# Patient Record
Sex: Female | Born: 1960 | Race: White | Hispanic: No | Marital: Married | Smoking: Never smoker
Health system: Southern US, Community
[De-identification: ages and names within clinical notes are randomized; demographics above are authoritative.]

## PROBLEM LIST (undated history)

## (undated) DIAGNOSIS — Z803 Family history of malignant neoplasm of breast: Secondary | ICD-10-CM

## (undated) DIAGNOSIS — Z800 Family history of malignant neoplasm of digestive organs: Secondary | ICD-10-CM

## (undated) HISTORY — DX: Family history of malignant neoplasm of breast: Z80.3

## (undated) HISTORY — DX: Family history of malignant neoplasm of digestive organs: Z80.0

---

## 1985-05-26 HISTORY — PX: TUBAL LIGATION: SHX77

## 2003-10-16 ENCOUNTER — Other Ambulatory Visit: Admission: RE | Admit: 2003-10-16 | Discharge: 2003-10-16 | Payer: Self-pay | Admitting: Gynecology

## 2004-01-30 ENCOUNTER — Other Ambulatory Visit: Admission: RE | Admit: 2004-01-30 | Discharge: 2004-01-30 | Payer: Self-pay | Admitting: Gynecology

## 2004-03-04 ENCOUNTER — Encounter: Admission: RE | Admit: 2004-03-04 | Discharge: 2004-03-04 | Payer: Self-pay | Admitting: Gynecology

## 2004-04-25 ENCOUNTER — Other Ambulatory Visit: Admission: RE | Admit: 2004-04-25 | Discharge: 2004-04-25 | Payer: Self-pay | Admitting: Gynecology

## 2004-10-14 ENCOUNTER — Encounter: Admission: RE | Admit: 2004-10-14 | Discharge: 2004-10-14 | Payer: Self-pay | Admitting: Gynecology

## 2004-11-20 ENCOUNTER — Other Ambulatory Visit: Admission: RE | Admit: 2004-11-20 | Discharge: 2004-11-20 | Payer: Self-pay | Admitting: Gynecology

## 2005-06-05 ENCOUNTER — Other Ambulatory Visit: Admission: RE | Admit: 2005-06-05 | Discharge: 2005-06-05 | Payer: Self-pay | Admitting: Gynecology

## 2005-06-23 ENCOUNTER — Encounter: Admission: RE | Admit: 2005-06-23 | Discharge: 2005-06-23 | Payer: Self-pay | Admitting: Gynecology

## 2005-07-11 ENCOUNTER — Encounter: Admission: RE | Admit: 2005-07-11 | Discharge: 2005-07-11 | Payer: Self-pay | Admitting: Gynecology

## 2006-05-26 HISTORY — PX: BREAST BIOPSY: SHX20

## 2006-07-23 ENCOUNTER — Other Ambulatory Visit: Admission: RE | Admit: 2006-07-23 | Discharge: 2006-07-23 | Payer: Self-pay | Admitting: Gynecology

## 2006-08-05 ENCOUNTER — Encounter: Admission: RE | Admit: 2006-08-05 | Discharge: 2006-08-05 | Payer: Self-pay | Admitting: Gynecology

## 2006-08-13 ENCOUNTER — Encounter: Admission: RE | Admit: 2006-08-13 | Discharge: 2006-08-13 | Payer: Self-pay | Admitting: Gynecology

## 2007-02-10 ENCOUNTER — Encounter: Admission: RE | Admit: 2007-02-10 | Discharge: 2007-02-10 | Payer: Self-pay | Admitting: Gynecology

## 2007-02-16 ENCOUNTER — Encounter (INDEPENDENT_AMBULATORY_CARE_PROVIDER_SITE_OTHER): Payer: Self-pay | Admitting: Diagnostic Radiology

## 2007-02-16 ENCOUNTER — Encounter: Admission: RE | Admit: 2007-02-16 | Discharge: 2007-02-16 | Payer: Self-pay | Admitting: Gynecology

## 2007-08-09 ENCOUNTER — Encounter: Admission: RE | Admit: 2007-08-09 | Discharge: 2007-08-09 | Payer: Self-pay | Admitting: Gynecology

## 2008-08-09 ENCOUNTER — Encounter: Admission: RE | Admit: 2008-08-09 | Discharge: 2008-08-09 | Payer: Self-pay | Admitting: Gynecology

## 2009-08-10 ENCOUNTER — Encounter: Admission: RE | Admit: 2009-08-10 | Discharge: 2009-08-10 | Payer: Self-pay | Admitting: Gynecology

## 2010-07-17 ENCOUNTER — Other Ambulatory Visit: Payer: Self-pay | Admitting: Gynecology

## 2010-07-17 DIAGNOSIS — Z1231 Encounter for screening mammogram for malignant neoplasm of breast: Secondary | ICD-10-CM

## 2010-08-21 ENCOUNTER — Ambulatory Visit: Payer: Self-pay

## 2010-08-21 ENCOUNTER — Ambulatory Visit
Admission: RE | Admit: 2010-08-21 | Discharge: 2010-08-21 | Disposition: A | Discharge status: Home / Self Care | Payer: 59 | Source: Ambulatory Visit | Attending: Gynecology | Admitting: Gynecology

## 2010-08-21 DIAGNOSIS — Z1231 Encounter for screening mammogram for malignant neoplasm of breast: Secondary | ICD-10-CM

## 2011-04-09 ENCOUNTER — Other Ambulatory Visit: Payer: Self-pay | Admitting: Gynecology

## 2011-05-27 HISTORY — PX: COLONOSCOPY: SHX174

## 2011-08-28 ENCOUNTER — Other Ambulatory Visit: Payer: Self-pay | Admitting: Dermatology

## 2011-09-11 ENCOUNTER — Other Ambulatory Visit: Payer: Self-pay | Admitting: Gynecology

## 2011-09-11 DIAGNOSIS — Z1231 Encounter for screening mammogram for malignant neoplasm of breast: Secondary | ICD-10-CM

## 2011-09-30 ENCOUNTER — Ambulatory Visit
Admission: RE | Admit: 2011-09-30 | Discharge: 2011-09-30 | Disposition: A | Discharge status: Home / Self Care | Payer: 59 | Source: Ambulatory Visit | Attending: Gynecology | Admitting: Gynecology

## 2011-09-30 DIAGNOSIS — Z1231 Encounter for screening mammogram for malignant neoplasm of breast: Secondary | ICD-10-CM

## 2012-07-16 ENCOUNTER — Other Ambulatory Visit: Payer: Self-pay | Admitting: Gynecology

## 2012-10-06 ENCOUNTER — Other Ambulatory Visit: Payer: Self-pay

## 2012-10-06 DIAGNOSIS — Z1231 Encounter for screening mammogram for malignant neoplasm of breast: Secondary | ICD-10-CM

## 2012-11-02 ENCOUNTER — Other Ambulatory Visit: Payer: Self-pay | Admitting: Dermatology

## 2012-11-09 ENCOUNTER — Ambulatory Visit
Admission: RE | Admit: 2012-11-09 | Discharge: 2012-11-09 | Disposition: A | Discharge status: Home / Self Care | Payer: 59 | Source: Ambulatory Visit

## 2012-11-09 DIAGNOSIS — Z1231 Encounter for screening mammogram for malignant neoplasm of breast: Secondary | ICD-10-CM

## 2014-01-16 ENCOUNTER — Other Ambulatory Visit: Payer: Self-pay

## 2014-01-16 DIAGNOSIS — Z1231 Encounter for screening mammogram for malignant neoplasm of breast: Secondary | ICD-10-CM

## 2014-02-02 ENCOUNTER — Encounter (INDEPENDENT_AMBULATORY_CARE_PROVIDER_SITE_OTHER): Payer: Self-pay

## 2014-02-02 ENCOUNTER — Ambulatory Visit
Admission: RE | Admit: 2014-02-02 | Discharge: 2014-02-02 | Disposition: A | Discharge status: Home / Self Care | Payer: 59 | Source: Ambulatory Visit

## 2014-02-02 DIAGNOSIS — Z1231 Encounter for screening mammogram for malignant neoplasm of breast: Secondary | ICD-10-CM

## 2015-04-16 ENCOUNTER — Other Ambulatory Visit: Payer: Self-pay

## 2015-04-16 DIAGNOSIS — Z1231 Encounter for screening mammogram for malignant neoplasm of breast: Secondary | ICD-10-CM

## 2015-05-29 ENCOUNTER — Ambulatory Visit
Admission: RE | Admit: 2015-05-29 | Discharge: 2015-05-29 | Disposition: A | Discharge status: Home / Self Care | Payer: Commercial Managed Care - HMO | Source: Ambulatory Visit

## 2015-05-29 DIAGNOSIS — Z1231 Encounter for screening mammogram for malignant neoplasm of breast: Secondary | ICD-10-CM

## 2015-12-12 ENCOUNTER — Other Ambulatory Visit: Payer: Self-pay | Admitting: Obstetrics and Gynecology

## 2015-12-12 DIAGNOSIS — E2839 Other primary ovarian failure: Secondary | ICD-10-CM

## 2015-12-24 ENCOUNTER — Ambulatory Visit
Admission: RE | Admit: 2015-12-24 | Discharge: 2015-12-24 | Disposition: A | Discharge status: Home / Self Care | Payer: Commercial Managed Care - HMO | Source: Ambulatory Visit | Attending: Obstetrics and Gynecology | Admitting: Obstetrics and Gynecology

## 2015-12-24 DIAGNOSIS — E2839 Other primary ovarian failure: Secondary | ICD-10-CM

## 2016-07-03 ENCOUNTER — Other Ambulatory Visit: Payer: Self-pay | Admitting: Obstetrics and Gynecology

## 2016-07-03 DIAGNOSIS — Z1231 Encounter for screening mammogram for malignant neoplasm of breast: Secondary | ICD-10-CM

## 2016-07-10 DIAGNOSIS — G43B0 Ophthalmoplegic migraine, not intractable: Secondary | ICD-10-CM | POA: Diagnosis not present

## 2016-07-18 ENCOUNTER — Encounter: Payer: Self-pay | Admitting: Radiology

## 2016-07-18 ENCOUNTER — Ambulatory Visit
Admission: RE | Admit: 2016-07-18 | Discharge: 2016-07-18 | Disposition: A | Discharge status: Home / Self Care | Payer: Commercial Managed Care - HMO | Source: Ambulatory Visit | Attending: Obstetrics and Gynecology | Admitting: Obstetrics and Gynecology

## 2016-07-18 DIAGNOSIS — Z1231 Encounter for screening mammogram for malignant neoplasm of breast: Secondary | ICD-10-CM

## 2017-02-16 DIAGNOSIS — Z130 Encounter for screening for diseases of the blood and blood-forming organs and certain disorders involving the immune mechanism: Secondary | ICD-10-CM | POA: Diagnosis not present

## 2017-02-16 DIAGNOSIS — Z01419 Encounter for gynecological examination (general) (routine) without abnormal findings: Secondary | ICD-10-CM | POA: Diagnosis not present

## 2017-07-21 ENCOUNTER — Other Ambulatory Visit: Payer: Self-pay | Admitting: Obstetrics and Gynecology

## 2017-07-21 DIAGNOSIS — Z1231 Encounter for screening mammogram for malignant neoplasm of breast: Secondary | ICD-10-CM

## 2017-08-11 ENCOUNTER — Ambulatory Visit
Admission: RE | Admit: 2017-08-11 | Discharge: 2017-08-11 | Disposition: A | Discharge status: Home / Self Care | Payer: 59 | Source: Ambulatory Visit | Attending: Obstetrics and Gynecology | Admitting: Obstetrics and Gynecology

## 2017-08-11 DIAGNOSIS — Z1231 Encounter for screening mammogram for malignant neoplasm of breast: Secondary | ICD-10-CM

## 2017-08-18 DIAGNOSIS — R05 Cough: Secondary | ICD-10-CM | POA: Diagnosis not present

## 2017-08-18 DIAGNOSIS — J209 Acute bronchitis, unspecified: Secondary | ICD-10-CM | POA: Diagnosis not present

## 2017-08-18 DIAGNOSIS — J040 Acute laryngitis: Secondary | ICD-10-CM | POA: Diagnosis not present

## 2017-09-01 DIAGNOSIS — J069 Acute upper respiratory infection, unspecified: Secondary | ICD-10-CM | POA: Diagnosis not present

## 2017-09-01 DIAGNOSIS — R0989 Other specified symptoms and signs involving the circulatory and respiratory systems: Secondary | ICD-10-CM | POA: Diagnosis not present

## 2017-09-01 DIAGNOSIS — J208 Acute bronchitis due to other specified organisms: Secondary | ICD-10-CM | POA: Diagnosis not present

## 2017-09-04 ENCOUNTER — Encounter (HOSPITAL_COMMUNITY): Payer: Self-pay | Admitting: Emergency Medicine

## 2017-09-04 ENCOUNTER — Other Ambulatory Visit: Payer: Self-pay

## 2017-09-04 ENCOUNTER — Emergency Department (HOSPITAL_COMMUNITY)
Admission: EM | Admit: 2017-09-04 | Discharge: 2017-09-04 | Disposition: A | Discharge status: Home / Self Care | Payer: 59 | Attending: Emergency Medicine | Admitting: Emergency Medicine

## 2017-09-04 ENCOUNTER — Emergency Department (HOSPITAL_COMMUNITY): Payer: 59

## 2017-09-04 DIAGNOSIS — R000 Tachycardia, unspecified: Secondary | ICD-10-CM | POA: Diagnosis not present

## 2017-09-04 DIAGNOSIS — R05 Cough: Secondary | ICD-10-CM | POA: Diagnosis not present

## 2017-09-04 DIAGNOSIS — J40 Bronchitis, not specified as acute or chronic: Secondary | ICD-10-CM | POA: Diagnosis not present

## 2017-09-04 DIAGNOSIS — R0981 Nasal congestion: Secondary | ICD-10-CM | POA: Diagnosis not present

## 2017-09-04 MED ORDER — ALBUTEROL SULFATE (2.5 MG/3ML) 0.083% IN NEBU
5.0000 mg | INHALATION_SOLUTION | Freq: Once | RESPIRATORY_TRACT | Status: AC
  Administered 2017-09-04: 5 mg via RESPIRATORY_TRACT
  Filled 2017-09-04: qty 6

## 2017-09-04 MED ORDER — PREDNISONE 20 MG PO TABS: 40.0000 mg | ORAL_TABLET | Freq: Every day | ORAL | 0 refills | Status: DC

## 2017-09-04 MED ORDER — PREDNISONE 20 MG PO TABS
60.0000 mg | ORAL_TABLET | Freq: Once | ORAL | Status: AC
  Administered 2017-09-04: 60 mg via ORAL
  Filled 2017-09-04: qty 3

## 2017-09-04 MED ORDER — IBUPROFEN 400 MG PO TABS
600.0000 mg | ORAL_TABLET | Freq: Once | ORAL | Status: AC
  Administered 2017-09-04: 600 mg via ORAL
  Filled 2017-09-04: qty 1

## 2017-09-04 NOTE — Discharge Instructions (Signed)
Continue using your albuterol inhaler every 4-6 hours as needed. Continue taking the benzonatate every 8 hours for cough. You can take Mucinex if this provides some relief of your congestion.  Be sure to drink plenty of water with this. Starting tomorrow, take the prednisone daily as prescribed until it is gone. Call the phone number on the back of your insurance card for primary care in the area.

## 2017-09-04 NOTE — ED Triage Notes (Addendum)
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

## 2017-09-04 NOTE — ED Provider Notes (Signed)
[facility] EMERGENCY DEPARTMENT Provider Note   CSN: [id number] Arrival date & time: 09/04/17  4098     History   Chief Complaint Chief Complaint  Patient presents with  . Cough  . Chest Pain    HPI Megan Rodriguez is a 57 y.o. female past medical history, presenting to the ED with persistent cough.  Patient states she had an upper respiratory infection a few weeks ago, and was prescribed azithromycin and an albuterol inhaler.  She states the symptoms never really resolved.  She states she has been having persistent cough since that time.  Was reevaluated and prescribed Tessalon and instructed to take Mucinex.  She states the albuterol and Tessalon helps, however the symptoms persist.  She states she is starting to feel sore on her bilateral lower ribs with coughing.  States cough is nonproductive.  Reports associated congestion.  Denies fever or chills, difficulty breathing, or other complaints.  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  History of smoking, asthma, COPD.  The history is provided by the patient.    History reviewed. No pertinent past medical history.  There are no active problems to display for this patient.   No past surgical history on file.   OB History   None      Home Medications    Prior to Admission medications   Medication Sig Start Date End Date Taking? Authorizing Provider  predniSONE (DELTASONE) 20 MG tablet Take 2 tablets (40 mg total) by mouth daily. 09/04/17   Robinson, [place] N, PA-C    Family History No family history on file.  Social History Social History   Tobacco Use  . Smoking status: Never Smoker  . Smokeless tobacco: Never Used  Substance Use Topics  . Alcohol use: Yes  . Drug use: Never     Allergies   Patient has no known allergies.   Review of Systems Review of Systems  Constitutional: Negative for chills and fever.  HENT: Positive for congestion. Negative for  sore throat.   Respiratory: Positive for cough. Negative for shortness of breath.   All other systems reviewed and are negative.    Physical Exam Updated Vital Signs BP (!) 142/81 (BP Location: Right Arm)   Pulse 88   Temp 99 F (37.2 C) (Oral)   Resp 20   LMP 05/18/2015 (Approximate)   SpO2 95%   Physical Exam  Constitutional: She appears well-developed and well-nourished. She does not appear ill. No distress.  HENT:  Head: Normocephalic and atraumatic.  Right Ear: Tympanic membrane, external ear and ear canal normal.  Left Ear: Tympanic membrane, external ear and ear canal normal.  Mouth/Throat: Oropharynx is clear and moist.  Eyes: Conjunctivae are normal.  Neck: Normal range of motion. Neck supple.  Cardiovascular: Normal rate, regular rhythm, normal heart sounds and intact distal pulses.  Pulmonary/Chest: Effort normal and breath sounds normal. No stridor. No respiratory distress. She has no wheezes. She has no rales.  Abdominal: Soft.  Lymphadenopathy:    She has no cervical adenopathy.  Neurological: She is alert.  Skin: Skin is warm.  Psychiatric: She has a normal mood and affect. Her behavior is normal.  Nursing note and vitals reviewed.    ED Treatments / Results  Labs (all labs ordered are listed, but only abnormal results are displayed) Labs Reviewed - No data to display  EKG None  Radiology Dg Chest 2 View  Result Date: 09/04/2017 CLINICAL  DATA:  Dry cough x 6 weeks, pain into left lower back and lower bilateral anterior chest, aches, crackles/wheezing, past smoker, pt shielded EXAM: CHEST - 2 VIEW COMPARISON:  None. FINDINGS: The heart size and mediastinal contours are within normal limits. Both lungs are clear. The visualized skeletal structures are unremarkable. IMPRESSION: No active cardiopulmonary disease. No evidence of pneumonia or pulmonary edema. Electronically Signed   By: Bary RichardStan  Maynard M.D.   On: 09/04/2017 09:47    Procedures Procedures  (including critical care time)  Medications Ordered in ED Medications  albuterol (PROVENTIL) (2.5 MG/3ML) 0.083% nebulizer solution 5 mg (5 mg Nebulization Given 09/04/17 1211)  predniSONE (DELTASONE) tablet 60 mg (60 mg Oral Given 09/04/17 1220)  ibuprofen (ADVIL,MOTRIN) tablet 600 mg (600 mg Oral Given 09/04/17 1220)     Initial Impression / Assessment and Plan / ED Course  I have reviewed the triage vital signs and the nursing notes.  Pertinent labs & imaging results that were available during my care of the patient were reviewed by me and considered in my medical decision making (see chart for details).     Pt presenting with persistent cough follow URI. Tessalon and albuterol have provided relief, however cough persists. On exam, afebrile, not in distress. Lungs CTAB. CXR neg for infiltrate. Albuterol neb treatment given in ED with improvement, PO prednisone. Will discharge with prednisone burst and instructions to continue tessalon and albuterol. Return precautions discussed. Safe for discharge.  Discussed results, findings, treatment and follow up. Patient advised of return precautions. Patient verbalized understanding and agreed with plan.  Final Clinical Impressions(s) / ED Diagnoses   Final diagnoses:  Bronchitis    ED Discharge Orders        Ordered    predniSONE (DELTASONE) 20 MG tablet  Daily     09/04/17 1317       Robinson, [place] N, [place] JerseyPA-C 09/04/17 1318    Mesner, Barbara CowerJason, MD 09/04/17 1643

## 2017-10-26 ENCOUNTER — Encounter

## 2017-10-26 ENCOUNTER — Ambulatory Visit: Payer: 59 | Admitting: Internal Medicine

## 2017-11-05 DIAGNOSIS — J019 Acute sinusitis, unspecified: Secondary | ICD-10-CM | POA: Diagnosis not present

## 2017-11-05 DIAGNOSIS — H66001 Acute suppurative otitis media without spontaneous rupture of ear drum, right ear: Secondary | ICD-10-CM | POA: Diagnosis not present

## 2017-11-05 DIAGNOSIS — J029 Acute pharyngitis, unspecified: Secondary | ICD-10-CM | POA: Diagnosis not present

## 2017-11-09 ENCOUNTER — Encounter: Payer: Self-pay | Admitting: Physician Assistant

## 2017-11-09 ENCOUNTER — Ambulatory Visit (INDEPENDENT_AMBULATORY_CARE_PROVIDER_SITE_OTHER): Payer: 59 | Admitting: Physician Assistant

## 2017-11-09 ENCOUNTER — Other Ambulatory Visit: Payer: Self-pay

## 2017-11-09 ENCOUNTER — Ambulatory Visit: Payer: 59 | Admitting: Physician Assistant

## 2017-11-09 VITALS — BP 136/84 | HR 76 | Temp 98.4°F | Resp 14 | Ht 63.0 in | Wt 133.0 lb

## 2017-11-09 DIAGNOSIS — Z23 Encounter for immunization: Secondary | ICD-10-CM | POA: Diagnosis not present

## 2017-11-09 DIAGNOSIS — Z0000 Encounter for general adult medical examination without abnormal findings: Secondary | ICD-10-CM | POA: Diagnosis not present

## 2017-11-09 NOTE — Progress Notes (Signed)
Patient ID: Megan Rodriguez MRN: [id number], DOB: August 30, 1960, 57 y.o. Date of Encounter: 11/09/2017,   Chief Complaint: New Patient, Establish Care, Physical (CPE)  HPI: 57 y.o. y/o female here for New Patient, Establish Care, CPE.   She presents as a new patient to establish care. She states that currently for GYN she sees Dr. Rosemary Holms.   Says that her prior GYN retired but they checked screening labs.   Therefore did not have any PCP.   However over the past year she has had issues with allergies and respiratory infections so has had to go to urgent care some.   Therefore decided she needed to have PCP established that she can go to for acute visits as well as checking labs and doing preventive care.  She reports that she has no known past medical history/medical problems.  No chronic medical problems.  She works at [employer].  Works 4 days a week. She has 2 grandsons who are 73 years old and 25 years old.  She keeps them 2 or 3 nights per week and also sees them on the weekends a lot.  States that their mom is a Tourist information centre manager so she watches them while she teaches dance those evenings.  She does no formal exercise.  She has no specific concerns to address today.  She has no premature family history of CAD or CA.   Review of Systems: Consitutional: No fever, chills, fatigue, night sweats, lymphadenopathy. No significant/unexplained weight changes. Eyes: No visual changes, eye redness, or discharge. ENT/Mouth: No ear pain, sore throat, nasal drainage, or sinus pain. Cardiovascular: No chest pressure,heaviness, tightness or squeezing, even with exertion. No increased shortness of breath or dyspnea on exertion.No palpitations, edema, orthopnea, PND. Respiratory: No cough, hemoptysis, SOB, or wheezing. Gastrointestinal: No anorexia, dysphagia, reflux, pain, nausea, vomiting, hematemesis, diarrhea, constipation, BRBPR, or melena. Breast: No mass, nodules, bulging, or retraction. No skin  changes or inflammation. No nipple discharge. No lymphadenopathy. Genitourinary: No dysuria, hematuria, incontinence, vaginal discharge, pruritis, burning, abnormal bleeding, or pain. Musculoskeletal: No decreased ROM, No joint pain or swelling. No significant pain in neck, back, or extremities. Skin: No rash, pruritis, or concerning lesions. Neurological: No headache, dizziness, syncope, seizures, tremors, memory loss, coordination problems, or paresthesias. Psychological: No anxiety, depression, hallucinations, SI/HI. Endocrine: No polydipsia, polyphagia, polyuria, or known diabetes.No increased fatigue. No palpitations/rapid heart rate. No significant/unexplained weight change. All other systems were reviewed and are otherwise negative.  No past medical history on file.   Past Surgical History:  Procedure Laterality Date  . TUBAL LIGATION  1987    Home Meds:  Outpatient Medications Prior to Visit  Medication Sig Dispense Refill  . predniSONE (DELTASONE) 20 MG tablet Take 2 tablets (40 mg total) by mouth daily. 10 tablet 0   No facility-administered medications prior to visit.     Allergies: No Known Allergies  Social History   Socioeconomic History  . Marital status: Married    Spouse name: Not on file  . Number of children: Not on file  . Years of education: Not on file  . Highest education level: Not on file  Occupational History  . Not on file  Social Needs  . Financial resource strain: Not on file  . Food insecurity:    Worry: Not on file    Inability: Not on file  . Transportation needs:    Medical: Not on file    Non-medical: Not on file  Tobacco Use  . Smoking status: Never  Smoker  . Smokeless tobacco: Never Used  Substance and Sexual Activity  . Alcohol use: Yes  . Drug use: Never  . Sexual activity: Not on file  Lifestyle  . Physical activity:    Days per week: Not on file    Minutes per session: Not on file  . Stress: Not on file  Relationships  .  Social connections:    Talks on phone: Not on file    Gets together: Not on file    Attends religious service: Not on file    Active member of club or organization: Not on file    Attends meetings of clubs or organizations: Not on file    Relationship status: Not on file  . Intimate partner violence:    Fear of current or ex partner: Not on file    Emotionally abused: Not on file    Physically abused: Not on file    Forced sexual activity: Not on file  Other Topics Concern  . Not on file  Social History Narrative  . Not on file    Family History  Problem Relation Age of Onset  . Stroke Mother        past smoker  . Cancer Maternal Grandmother   . Heart disease Maternal Grandfather        MI--- at older age  . Diabetes Paternal Grandmother     Physical Exam: Blood pressure 136/84, pulse 76, temperature 98.4 F (36.9 C), temperature source Oral, resp. rate 14, height 5\' 3"  (1.6 m), weight 60.3 kg (133 lb), last menstrual period 05/18/2015, SpO2 98 %., Body mass index is 23.56 kg/m. General: Well developed, well nourished WF. Appears in no acute distress. HEENT: Normocephalic, atraumatic. Conjunctiva pink, sclera non-icteric. Pupils 2 mm constricting to 1 mm, round, regular, and equally reactive to light and accomodation. EOMI. Internal auditory canal clear. TMs with good cone of light and without pathology. Nasal mucosa pink. Nares are without discharge. No sinus tenderness. Oral mucosa pink. Neck: Supple. Trachea midline. No thyromegaly. Full ROM. No lymphadenopathy.No Carotid Bruits. Lungs: Clear to auscultation bilaterally without wheezes, rales, or rhonchi. Breathing is of normal effort and unlabored. Cardiovascular: RRR with S1 S2. No murmurs, rubs, or gallops. Distal pulses 2+ symmetrically. No carotid or abdominal bruits. Breast: Per Gyn Abdomen: Soft, non-tender, non-distended with normoactive bowel sounds. No hepatosplenomegaly or masses. No rebound/guarding. No CVA  tenderness. No hernias.  Genitourinary:  Per Gyn Musculoskeletal: Full range of motion and 5/5 strength throughout.  Skin: Warm and moist without erythema, ecchymosis, wounds, or rash. Neuro: A+Ox3. CN II-XII grossly intact. Moves all extremities spontaneously. Full sensation throughout. Normal gait.  Psych:  Responds to questions appropriately with a normal affect.   Assessment/Plan:  57 y.o. y/o female here for CPE  1. Encounter for preventive health examination 2. Encounter for medical examination to establish care  A. Screening Labs: She is fasting.  Agreeable to check labs now. - CBC with Differential/Platelet - COMPLETE METABOLIC PANEL WITH GFR - Lipid panel - TSH - VITAMIN D 25 Hydroxy (Vit-D Deficiency, Fractures)  B. Pap: --Per Gyn--Dr. Sherrie Sportavon  C. Screening Mammogram: --Per Gyn--Dr. Rosemary Holmsavon --This is in epic.  Mammogram was performed 08/11/2017.  Negative.   D. DEXA/BMD:   E. Colorectal Cancer Screening: She reports that she had colonoscopy at age 57-51.  Performed by Dr. Kinnie ScalesMedoff.  Normal.  Repeat 10 years.  F. Immunizations:  Influenza:--------------N/A Tetanus:----She reports that she has had no tetanus vaccine in > 10 years.  She  is agreeable to get Tdap today.  Tdap--Given here 11/09/2017. Pneumococcal: she has no indication to require pneumonia vaccine until age 43. Shingrix:   Today I discussed risk and benefits of Shingrix.  If she decides that she wants to get this she will contact her insurance regarding cost and coverage and then if still wants to proceed will go to pharmacy to receive vaccine there.    Signed, [address] [place], [place], [facility] 11/09/2017 10:40 AM

## 2017-11-09 NOTE — Addendum Note (Signed)
Addended by: Phineas SemenJOHNSON, Chigozie Basaldua A on: 11/09/2017 11:22 AM   Modules accepted: Orders

## 2017-11-10 LAB — COMPLETE METABOLIC PANEL WITH GFR
AG Ratio: 1.7 (calc) (ref 1.0–2.5)
ALT: 28 U/L (ref 6–29)
AST: 20 U/L (ref 10–35)
Albumin: 4.7 g/dL (ref 3.6–5.1)
Alkaline phosphatase (APISO): 59 U/L (ref 33–130)
BUN: 13 mg/dL (ref 7–25)
CO2: 27 mmol/L (ref 20–32)
Calcium: 10 mg/dL (ref 8.6–10.4)
Chloride: 102 mmol/L (ref 98–110)
Creat: 0.72 mg/dL (ref 0.50–1.05)
GFR, Est African American: 108 mL/min/{1.73_m2} (ref >=60)
GFR, Est Non African American: 93 mL/min/{1.73_m2} (ref >=60)
Globulin: 2.8 g/dL (calc) (ref 1.9–3.7)
Glucose, Bld: 102 mg/dL — ABNORMAL HIGH (ref 65–99)
Potassium: 4.4 mmol/L (ref 3.5–5.3)
Sodium: 140 mmol/L (ref 135–146)
Total Bilirubin: 0.8 mg/dL (ref 0.2–1.2)
Total Protein: 7.5 g/dL (ref 6.1–8.1)

## 2017-11-10 LAB — CBC WITH DIFFERENTIAL/PLATELET
Basophils Absolute: 99 cells/uL (ref 0–200)
Basophils Relative: 1.6 %
Eosinophils Absolute: 81 cells/uL (ref 15–500)
Eosinophils Relative: 1.3 %
HCT: 39.9 % (ref 35.0–45.0)
Hemoglobin: 14 g/dL (ref 11.7–15.5)
Lymphs Abs: 1835 cells/uL (ref 850–3900)
MCH: 29.4 pg (ref 27.0–33.0)
MCHC: 35.1 g/dL (ref 32.0–36.0)
MCV: 83.6 fL (ref 80.0–100.0)
MPV: 11 fL (ref 7.5–12.5)
Monocytes Relative: 8.9 %
Neutro Abs: 3633 cells/uL (ref 1500–7800)
Neutrophils Relative %: 58.6 %
Platelets: 274 10*3/uL (ref 140–400)
RBC: 4.77 10*6/uL (ref 3.80–5.10)
RDW: 13.5 % (ref 11.0–15.0)
Total Lymphocyte: 29.6 %
WBC mixed population: 552 cells/uL (ref 200–950)
WBC: 6.2 10*3/uL (ref 3.8–10.8)

## 2017-11-10 LAB — LIPID PANEL
Cholesterol: 213 mg/dL — ABNORMAL HIGH (ref <200)
HDL: 46 mg/dL — ABNORMAL LOW (ref >50)
LDL Cholesterol (Calc): 141 mg/dL (calc) — ABNORMAL HIGH
Non-HDL Cholesterol (Calc): 167 mg/dL (calc) — ABNORMAL HIGH (ref <130)
Total CHOL/HDL Ratio: 4.6 (calc) (ref <5.0)
Triglycerides: 133 mg/dL (ref <150)

## 2017-11-10 LAB — VITAMIN D 25 HYDROXY (VIT D DEFICIENCY, FRACTURES): Vit D, 25-Hydroxy: 33 ng/mL (ref 30–100)

## 2017-11-10 LAB — TSH: TSH: 1.27 mIU/L (ref 0.40–4.50)

## 2018-05-27 DIAGNOSIS — Z23 Encounter for immunization: Secondary | ICD-10-CM | POA: Diagnosis not present

## 2018-10-28 ENCOUNTER — Other Ambulatory Visit: Payer: Self-pay | Admitting: Obstetrics and Gynecology

## 2018-10-28 DIAGNOSIS — Z1231 Encounter for screening mammogram for malignant neoplasm of breast: Secondary | ICD-10-CM

## 2018-12-14 ENCOUNTER — Encounter: Payer: 59 | Admitting: Family Medicine

## 2018-12-15 ENCOUNTER — Ambulatory Visit: Payer: 59

## 2018-12-31 ENCOUNTER — Other Ambulatory Visit: Payer: Self-pay

## 2018-12-31 ENCOUNTER — Telehealth: Payer: Self-pay | Admitting: Family Medicine

## 2018-12-31 ENCOUNTER — Encounter: Payer: Self-pay | Admitting: Family Medicine

## 2018-12-31 DIAGNOSIS — Z20828 Contact with and (suspected) exposure to other viral communicable diseases: Secondary | ICD-10-CM

## 2018-12-31 DIAGNOSIS — Z20822 Contact with and (suspected) exposure to covid-19: Secondary | ICD-10-CM

## 2018-12-31 NOTE — Telephone Encounter (Signed)
Patient was exposed to covid at her work place, now is nauseous and wants to know what she should do  Please call and advise  [phone]

## 2018-12-31 NOTE — Telephone Encounter (Signed)
FYI.  Orders have been placed.

## 2018-12-31 NOTE — Telephone Encounter (Signed)
See pt message note - this has already been taken addressed

## 2019-01-01 LAB — NOVEL CORONAVIRUS, NAA: SARS-CoV-2, NAA: NOT DETECTED

## 2019-01-17 ENCOUNTER — Ambulatory Visit: Payer: 59

## 2019-03-01 ENCOUNTER — Ambulatory Visit
Admission: RE | Admit: 2019-03-01 | Discharge: 2019-03-01 | Disposition: A | Discharge status: Home / Self Care | Payer: 59 | Source: Ambulatory Visit | Attending: Obstetrics and Gynecology | Admitting: Obstetrics and Gynecology

## 2019-03-01 ENCOUNTER — Other Ambulatory Visit: Payer: Self-pay

## 2019-03-01 DIAGNOSIS — Z1231 Encounter for screening mammogram for malignant neoplasm of breast: Secondary | ICD-10-CM

## 2019-11-14 ENCOUNTER — Encounter: Payer: Self-pay | Admitting: Nurse Practitioner

## 2019-11-14 ENCOUNTER — Other Ambulatory Visit: Payer: Self-pay

## 2019-11-14 ENCOUNTER — Ambulatory Visit (INDEPENDENT_AMBULATORY_CARE_PROVIDER_SITE_OTHER): Payer: 59 | Admitting: Nurse Practitioner

## 2019-11-14 VITALS — BP 138/90 | HR 85 | Temp 98.4°F | Resp 18 | Ht 62.0 in | Wt 138.4 lb

## 2019-11-14 DIAGNOSIS — Z13220 Encounter for screening for lipoid disorders: Secondary | ICD-10-CM

## 2019-11-14 DIAGNOSIS — R030 Elevated blood-pressure reading, without diagnosis of hypertension: Secondary | ICD-10-CM

## 2019-11-14 DIAGNOSIS — Z13228 Encounter for screening for other metabolic disorders: Secondary | ICD-10-CM | POA: Diagnosis not present

## 2019-11-14 DIAGNOSIS — Z1321 Encounter for screening for nutritional disorder: Secondary | ICD-10-CM | POA: Diagnosis not present

## 2019-11-14 DIAGNOSIS — Z0001 Encounter for general adult medical examination with abnormal findings: Secondary | ICD-10-CM

## 2019-11-14 DIAGNOSIS — Z1159 Encounter for screening for other viral diseases: Secondary | ICD-10-CM

## 2019-11-14 DIAGNOSIS — Z23 Encounter for immunization: Secondary | ICD-10-CM

## 2019-11-14 DIAGNOSIS — Z1329 Encounter for screening for other suspected endocrine disorder: Secondary | ICD-10-CM

## 2019-11-14 HISTORY — DX: Elevated blood-pressure reading, without diagnosis of hypertension: R03.0

## 2019-11-14 NOTE — Progress Notes (Signed)
Established Patient Office Visit  Subjective:  Patient ID: Megan Rodriguez, female    DOB: November 19, 1960  Age: 59 y.o. MRN: [id number]  CC:  Chief Complaint  Patient presents with  . Annual Exam    no pap    HPI Megan Rodriguez is a 59 year old female presenting for annual general adult examination. She feels in overall good health. Denied cp, ct, gu/gi sxs, pain, sob, edema, palpitations, recent injury/falls, or skin concerns.   Time spent discussing blood pressure treatment plan. The pt reports that she has h/o white coat syndrome.   Past Medical History:  Diagnosis Date  . White coat syndrome without diagnosis of hypertension 11/14/2019    Past Surgical History:  Procedure Laterality Date  . TUBAL LIGATION  1987    Family History  Problem Relation Age of Onset  . Stroke Mother        past smoker  . Cancer Maternal Grandmother   . Heart disease Maternal Grandfather        MI--- at older age  . Diabetes Paternal Grandmother     Social History   Socioeconomic History  . Marital status: Married    Spouse name: Not on file  . Number of children: Not on file  . Years of education: Not on file  . Highest education level: Not on file  Occupational History  . Not on file  Tobacco Use  . Smoking status: Never Smoker  . Smokeless tobacco: Never Used  Substance and Sexual Activity  . Alcohol use: Yes  . Drug use: Never  . Sexual activity: Yes  Other Topics Concern  . Not on file  Social History Narrative  . Not on file   Social Determinants of Health   Financial Resource Strain:   . Difficulty of Paying Living Expenses:   Food Insecurity:   . Worried About Programme researcher, broadcasting/film/video in the Last Year:   . Barista in the Last Year:   Transportation Needs:   . Freight forwarder (Medical):   Marland Kitchen Lack of Transportation (Non-Medical):   Physical Activity:   . Days of Exercise per Week:   . Minutes of Exercise per Session:   Stress:   . Feeling of Stress :   Social  Connections:   . Frequency of Communication with Friends and Family:   . Frequency of Social Gatherings with Friends and Family:   . Attends Religious Services:   . Active Member of Clubs or Organizations:   . Attends Banker Meetings:   Marland Kitchen Marital Status:   Intimate Partner Violence:   . Fear of Current or Ex-Partner:   . Emotionally Abused:   Marland Kitchen Physically Abused:   . Sexually Abused:     No outpatient medications prior to visit.   No facility-administered medications prior to visit.    No Known Allergies  ROS Review of Systems  All other systems reviewed and are negative.     Objective:    Physical Exam Vitals and nursing note reviewed.  Constitutional:      Appearance: Normal appearance. She is well-developed and well-groomed. She is not ill-appearing or toxic-appearing.  HENT:     Head: Normocephalic.     Right Ear: Hearing and external ear normal.     Left Ear: Hearing and external ear normal.     Nose: Nose normal.     Right Sinus: No maxillary sinus tenderness or frontal sinus tenderness.     Left Sinus:  No maxillary sinus tenderness or frontal sinus tenderness.     Mouth/Throat:     Lips: Pink.  Eyes:     General: Lids are normal. Lids are everted, no foreign bodies appreciated.     Extraocular Movements: Extraocular movements intact.     Right eye: Normal extraocular motion and no nystagmus.     Left eye: Normal extraocular motion and no nystagmus.     Conjunctiva/sclera: Conjunctivae normal.     Pupils: Pupils are equal, round, and reactive to light.  Neck:     Thyroid: No thyromegaly.     Vascular: No carotid bruit or JVD.  Cardiovascular:     Rate and Rhythm: Normal rate and regular rhythm.     Pulses: Normal pulses.     Heart sounds: Normal heart sounds, S1 normal and S2 normal.  Pulmonary:     Effort: Pulmonary effort is normal.     Breath sounds: Normal breath sounds.  Abdominal:     General: Abdomen is flat. Bowel sounds are  normal. There is no distension or abdominal bruit.     Palpations: Abdomen is soft. There is no hepatomegaly or splenomegaly.     Tenderness: There is no abdominal tenderness. There is no guarding or rebound.  Musculoskeletal:        General: No swelling. Normal range of motion.     Cervical back: Full passive range of motion without pain, normal range of motion and neck supple.     Right lower leg: No edema.     Left lower leg: No edema.  Lymphadenopathy:     Head:     Right side of head: No submental, submandibular, tonsillar or occipital adenopathy.     Left side of head: No submental, submandibular, tonsillar or occipital adenopathy.     Cervical: No cervical adenopathy.  Skin:    General: Skin is warm and dry.     Capillary Refill: Capillary refill takes less than 2 seconds.     Coloration: Skin is not jaundiced.     Findings: No bruising.  Neurological:     General: No focal deficit present.     Mental Status: She is alert and oriented to person, place, and time.  Psychiatric:        Attention and Perception: Attention and perception normal.        Mood and Affect: Mood and affect normal.        Speech: Speech normal.        Behavior: Behavior normal. Behavior is cooperative.        Thought Content: Thought content normal.        Cognition and Memory: Cognition and memory normal.        Judgment: Judgment normal.     BP 138/90 (BP Location: Left Arm, Patient Position: Sitting, Cuff Size: Normal)   Pulse 85   Temp 98.4 F (36.9 C) (Temporal)   Resp 18   Ht 5\' 2"  (1.575 m)   Wt 138 lb 6.4 oz (62.8 kg)   LMP 05/18/2015 (Approximate)   SpO2 96%   BMI 25.31 kg/m  Wt Readings from Last 3 Encounters:  11/14/19 138 lb 6.4 oz (62.8 kg)  11/09/17 133 lb (60.3 kg)     Health Maintenance Due  Topic Date Due  . Hepatitis C Screening  Never done  . COVID-19 Vaccine (1) Never done  . HIV Screening  Never done  . COLONOSCOPY  Never done  . PAP SMEAR-Modifier  07/17/2015  There are no preventive care reminders to display for this patient.  Lab Results  Component Value Date   TSH 1.27 11/09/2017   Lab Results  Component Value Date   WBC 6.2 11/09/2017   HGB 14.0 11/09/2017   HCT 39.9 11/09/2017   MCV 83.6 11/09/2017   PLT 274 11/09/2017   Lab Results  Component Value Date   NA 140 11/09/2017   K 4.4 11/09/2017   CO2 27 11/09/2017   GLUCOSE 102 (H) 11/09/2017   BUN 13 11/09/2017   CREATININE 0.72 11/09/2017   BILITOT 0.8 11/09/2017   AST 20 11/09/2017   ALT 28 11/09/2017   PROT 7.5 11/09/2017   CALCIUM 10.0 11/09/2017   Lab Results  Component Value Date   CHOL 213 (H) 11/09/2017   Lab Results  Component Value Date   HDL 46 (L) 11/09/2017   Lab Results  Component Value Date   LDLCALC 141 (H) 11/09/2017   Lab Results  Component Value Date   TRIG 133 11/09/2017   Lab Results  Component Value Date   CHOLHDL 4.6 11/09/2017   No results found for: HGBA1C    Assessment & Plan:   Problem List Items Addressed This Visit      Cardiovascular and Mediastinum   White coat syndrome without diagnosis of hypertension    Other Visit Diagnoses    Screening, lipid    -  Primary   Relevant Orders   Lipid panel   Screening for metabolic disorder       Relevant Orders   CBC with Differential/Platelet   COMPLETE METABOLIC PANEL WITH GFR   Encounter for vitamin deficiency screening       Relevant Orders   VITAMIN D 25 Hydroxy (Vit-D Deficiency, Fractures)   Thyroid disorder screening       Relevant Orders   TSH   Need for shingles vaccine       Relevant Orders   Varicella-zoster vaccine IM (Shingrix)   Need for hepatitis C screening test       Relevant Orders   Hepatitis panel, acute    Get exercise 20 minutes daily at least 4 times per week  Drink plenty of water  Blood pressure today 138/90,  Take blood pressure at home, goal BP is 140/90 or less, seek medical attention for out of this range  Labs today, office will  call with abnormal results and directions as indicated  Follow a low fat, low cholesterol, low carbohydrate, low sugar diet  Consider Shingrex Vaccine  Consider COVID vaccine    Follow-up: Return in about 1 year (around 11/13/2020).    Elmore Guise, FNP

## 2019-11-14 NOTE — Patient Instructions (Signed)
Get exercise 20 minutes daily at least 4 times per week  Drink plenty of water  Take blood pressure at home, goal BP is 140/90 or less, seek medical attention for out of this range  Labs today, office will call with abnormal results and directions as indicated  Follow a low fat, low cholesterol, low carbohydrate, low sugar diet  Consider Shingrex Vaccine

## 2019-11-15 ENCOUNTER — Other Ambulatory Visit: Payer: Self-pay | Admitting: Nurse Practitioner

## 2019-11-15 LAB — CBC WITH DIFFERENTIAL/PLATELET
Absolute Monocytes: 360 cells/uL (ref 200–950)
Basophils Absolute: 70 cells/uL (ref 0–200)
Basophils Relative: 1.2 %
Eosinophils Absolute: 81 cells/uL (ref 15–500)
Eosinophils Relative: 1.4 %
HCT: 42.4 % (ref 35.0–45.0)
Hemoglobin: 14.2 g/dL (ref 11.7–15.5)
Lymphs Abs: 1485 cells/uL (ref 850–3900)
MCH: 29.4 pg (ref 27.0–33.0)
MCHC: 33.5 g/dL (ref 32.0–36.0)
MCV: 87.8 fL (ref 80.0–100.0)
MPV: 10.8 fL (ref 7.5–12.5)
Monocytes Relative: 6.2 %
Neutro Abs: 3805 cells/uL (ref 1500–7800)
Neutrophils Relative %: 65.6 %
Platelets: 291 10*3/uL (ref 140–400)
RBC: 4.83 10*6/uL (ref 3.80–5.10)
RDW: 12.9 % (ref 11.0–15.0)
Total Lymphocyte: 25.6 %
WBC: 5.8 10*3/uL (ref 3.8–10.8)

## 2019-11-15 LAB — LIPID PANEL
Cholesterol: 191 mg/dL (ref <200)
HDL: 47 mg/dL — ABNORMAL LOW (ref >=50)
LDL Cholesterol (Calc): 118 mg/dL (calc) — ABNORMAL HIGH
Non-HDL Cholesterol (Calc): 144 mg/dL (calc) — ABNORMAL HIGH (ref <130)
Total CHOL/HDL Ratio: 4.1 (calc) (ref <5.0)
Triglycerides: 138 mg/dL (ref <150)

## 2019-11-15 LAB — TSH: TSH: 1.27 mIU/L (ref 0.40–4.50)

## 2019-11-15 LAB — COMPLETE METABOLIC PANEL WITH GFR
AG Ratio: 1.6 (calc) (ref 1.0–2.5)
ALT: 30 U/L — ABNORMAL HIGH (ref 6–29)
AST: 23 U/L (ref 10–35)
Albumin: 4.6 g/dL (ref 3.6–5.1)
Alkaline phosphatase (APISO): 54 U/L (ref 37–153)
BUN: 11 mg/dL (ref 7–25)
CO2: 27 mmol/L (ref 20–32)
Calcium: 9.8 mg/dL (ref 8.6–10.4)
Chloride: 103 mmol/L (ref 98–110)
Creat: 0.82 mg/dL (ref 0.50–1.05)
GFR, Est African American: 91 mL/min/{1.73_m2} (ref >=60)
GFR, Est Non African American: 78 mL/min/{1.73_m2} (ref >=60)
Globulin: 2.9 g/dL (calc) (ref 1.9–3.7)
Glucose, Bld: 117 mg/dL — ABNORMAL HIGH (ref 65–99)
Potassium: 4.6 mmol/L (ref 3.5–5.3)
Sodium: 141 mmol/L (ref 135–146)
Total Bilirubin: 0.7 mg/dL (ref 0.2–1.2)
Total Protein: 7.5 g/dL (ref 6.1–8.1)

## 2019-11-15 LAB — HEPATITIS PANEL, ACUTE
Hep A IgM: NONREACTIVE
Hep B C IgM: NONREACTIVE
Hepatitis B Surface Ag: NONREACTIVE
Hepatitis C Ab: NONREACTIVE
SIGNAL TO CUT-OFF: 0.01 (ref <1.00)

## 2019-11-15 LAB — VITAMIN D 25 HYDROXY (VIT D DEFICIENCY, FRACTURES): Vit D, 25-Hydroxy: 40 ng/mL (ref 30–100)

## 2019-11-15 NOTE — Progress Notes (Signed)
Labs are unremarkable. Follow up as planned. Instructions as per visit.

## 2019-11-21 ENCOUNTER — Encounter: Payer: 59 | Admitting: Nurse Practitioner

## 2020-02-10 ENCOUNTER — Other Ambulatory Visit: Payer: Self-pay | Admitting: Obstetrics and Gynecology

## 2020-02-10 DIAGNOSIS — Z1231 Encounter for screening mammogram for malignant neoplasm of breast: Secondary | ICD-10-CM

## 2020-03-01 ENCOUNTER — Other Ambulatory Visit: Payer: Self-pay

## 2020-03-01 ENCOUNTER — Ambulatory Visit
Admission: RE | Admit: 2020-03-01 | Discharge: 2020-03-01 | Disposition: A | Discharge status: Home / Self Care | Payer: 59 | Source: Ambulatory Visit | Attending: Obstetrics and Gynecology | Admitting: Obstetrics and Gynecology

## 2020-03-01 DIAGNOSIS — Z1231 Encounter for screening mammogram for malignant neoplasm of breast: Secondary | ICD-10-CM

## 2020-05-21 ENCOUNTER — Telehealth: Payer: Self-pay

## 2020-05-21 ENCOUNTER — Ambulatory Visit
Admission: EM | Admit: 2020-05-21 | Discharge: 2020-05-21 | Disposition: A | Discharge status: Home / Self Care | Payer: 59 | Attending: Internal Medicine | Admitting: Internal Medicine

## 2020-05-21 DIAGNOSIS — R6889 Other general symptoms and signs: Secondary | ICD-10-CM

## 2020-05-21 DIAGNOSIS — Z1152 Encounter for screening for COVID-19: Secondary | ICD-10-CM

## 2020-05-21 MED ORDER — IBUPROFEN 600 MG PO TABS: 600.0000 mg | ORAL_TABLET | Freq: Four times a day (QID) | ORAL | 0 refills | Status: DC | PRN

## 2020-05-21 MED ORDER — BENZONATATE 100 MG PO CAPS: 100.0000 mg | ORAL_CAPSULE | Freq: Three times a day (TID) | ORAL | 0 refills | Status: DC

## 2020-05-21 NOTE — Telephone Encounter (Signed)
Patient was able to get on list for Infusion Clinic, patient made aware she's been added to the list and and someone will give her a call.

## 2020-05-21 NOTE — Telephone Encounter (Signed)
Agree with referral to infusion clinic.  Otherwise treat symptomatically and go to ER if sob.

## 2020-05-21 NOTE — ED Triage Notes (Signed)
Pt presents with fever , headache and nasal congestion, home covid test positive

## 2020-05-21 NOTE — Telephone Encounter (Signed)
Mrs. Megan Rodriguez has tested positive with covid. She has not been vaccinated, headache, fever, coughing, congestion, nausea. Tested positive this morning, symptoms began 05-20-20 will reach out to [facility].

## 2020-05-21 NOTE — Discharge Instructions (Signed)
Please increase oral fluid intake Quarantine until COVID-19 test results are available Take medications as directed Please sign up for MyChart to see your lab results We will call you with recommendations if your lab results are abnormal.

## 2020-05-22 LAB — NOVEL CORONAVIRUS, NAA: SARS-CoV-2, NAA: DETECTED — AB

## 2020-05-22 LAB — SARS-COV-2, NAA 2 DAY TAT

## 2020-05-27 ENCOUNTER — Encounter: Payer: Self-pay | Admitting: Emergency Medicine

## 2020-05-27 ENCOUNTER — Ambulatory Visit (INDEPENDENT_AMBULATORY_CARE_PROVIDER_SITE_OTHER): Payer: 59

## 2020-05-27 ENCOUNTER — Ambulatory Visit
Admission: EM | Admit: 2020-05-27 | Discharge: 2020-05-27 | Disposition: A | Discharge status: Home / Self Care | Payer: 59 | Attending: Family Medicine | Admitting: Family Medicine

## 2020-05-27 DIAGNOSIS — U071 COVID-19: Secondary | ICD-10-CM | POA: Diagnosis not present

## 2020-05-27 DIAGNOSIS — R0989 Other specified symptoms and signs involving the circulatory and respiratory systems: Secondary | ICD-10-CM

## 2020-05-27 DIAGNOSIS — J1282 Pneumonia due to coronavirus disease 2019: Secondary | ICD-10-CM | POA: Diagnosis not present

## 2020-05-27 DIAGNOSIS — R059 Cough, unspecified: Secondary | ICD-10-CM

## 2020-05-27 MED ORDER — ONDANSETRON HCL 4 MG PO TABS: 4.0000 mg | ORAL_TABLET | Freq: Four times a day (QID) | ORAL | 0 refills | Status: DC

## 2020-05-27 NOTE — ED Provider Notes (Signed)
[facility]    CSN: [id number] Arrival date & time: 05/27/20  1028      History   Chief Complaint No chief complaint on file.   HPI Megan Rodriguez is a 60 y.o. female.   HPI   Patient has had an upper respiratory infection and cough for 8 days.  Had a positive Covid test 6 days ago.  She is here because she still having fevers and chills, extreme fatigue.  Headaches and body aches.  Nausea.  Decreased appetite. She is otherwise in good health.  Past Medical History:  Diagnosis Date  . White coat syndrome without diagnosis of hypertension 11/14/2019    Patient Active Problem List   Diagnosis Date Noted  . White coat syndrome without diagnosis of hypertension 11/14/2019    Past Surgical History:  Procedure Laterality Date  . BREAST BIOPSY Left 2008  . TUBAL LIGATION  1987    OB History   No obstetric history on file.      Home Medications    Prior to Admission medications   Medication Sig Start Date End Date Taking? Authorizing Provider  ondansetron (ZOFRAN) 4 MG tablet Take 1-2 tablets (4-8 mg total) by mouth every 6 (six) hours. As needed nausea 05/27/20  Yes Eustace Moore, MD  benzonatate (TESSALON) 100 MG capsule Take 1 capsule (100 mg total) by mouth every 8 (eight) hours. 05/21/20   Merrilee Jansky, MD  ibuprofen (ADVIL) 600 MG tablet Take 1 tablet (600 mg total) by mouth every 6 (six) hours as needed. 05/21/20   LampteyBritta Mccreedy, MD    Family History Family History  Problem Relation Age of Onset  . Stroke Mother        past smoker  . Cancer Maternal Grandmother   . Heart disease Maternal Grandfather        MI--- at older age  . Diabetes Paternal Grandmother     Social History Social History   Tobacco Use  . Smoking status: Never Smoker  . Smokeless tobacco: Never Used  Substance Use Topics  . Alcohol use: Yes  . Drug use: Never     Allergies   Patient has no known allergies.   Review of Systems Review of Systems  See  HPI  physical Exam Triage Vital Signs ED Triage Vitals [05/27/20 1111]  Enc Vitals Group     BP 121/77     Pulse Rate 76     Resp 19     Temp 98.7 F (37.1 C)     Temp Source Oral     SpO2 (!) 89 %     Weight      Height      Head Circumference      Peak Flow      Pain Score      Pain Loc      Pain Edu?      Excl. in GC?    No data found.  Updated Vital Signs BP 121/77 (BP Location: Right Arm)   Pulse 76   Temp 98.7 F (37.1 C) (Oral)   Resp 19   LMP 05/18/2015 (Approximate)   SpO2 91%      Physical Exam Constitutional:      General: She is not in acute distress.    Appearance: She is well-developed and well-nourished. She is ill-appearing.  HENT:     Head: Normocephalic and atraumatic.     Mouth/Throat:     Mouth: Oropharynx is clear and moist.  Comments: Mask is in place Eyes:     Conjunctiva/sclera: Conjunctivae normal.     Pupils: Pupils are equal, round, and reactive to light.  Cardiovascular:     Rate and Rhythm: Normal rate and regular rhythm.     Heart sounds: Normal heart sounds.  Pulmonary:     Effort: Pulmonary effort is normal. No respiratory distress.     Breath sounds: Rales present.     Comments: Bilateral rales Abdominal:     Palpations: Abdomen is soft.  Musculoskeletal:        General: No edema. Normal range of motion.     Cervical back: Normal range of motion.  Skin:    General: Skin is warm and dry.  Neurological:     General: No focal deficit present.     Mental Status: She is alert.  Psychiatric:        Behavior: Behavior normal.      UC Treatments / Results  Labs (all labs ordered are listed, but only abnormal results are displayed) Labs Reviewed - No data to display  EKG   Radiology DG Chest 2 View  Result Date: 05/27/2020 CLINICAL DATA:  Cough, COVID, congestion EXAM: CHEST - 2 VIEW COMPARISON:  09/04/2017 FINDINGS: Patchy peripheral airspace opacities in both lungs compatible with pneumonia. Heart is normal  size. No effusions or pneumothorax. No acute bony abnormality. IMPRESSION: Patchy peripheral bilateral airspace disease most compatible with COVID pneumonia. Electronically Signed   By: Charlett Nose M.D.   On: 05/27/2020 11:40    Procedures Procedures (including critical care time)  Medications Ordered in UC Medications - No data to display  Initial Impression / Assessment and Plan / UC Course  I have reviewed the triage vital signs and the nursing notes.  Pertinent labs & imaging results that were available during my care of the patient were reviewed by me and considered in my medical decision making (see chart for details).     Patient has a resting oxygen level of 91%, it dropped to 89% with ambulation.  Patient is not at a level of hypoxia that she needs hospitalization, however I cautioned her if she feels worse instead of better at any time she needs to go to the emergency room Symptomatic care as discussed.  Patient's biggest problem right now is nausea.  We will treat with Covid, increase fluids Final Clinical Impressions(s) / UC Diagnoses   Final diagnoses:  Pneumonia due to COVID-19 virus     Discharge Instructions     Continue to rest Try to drink more fluids I am prescribing Zofran for nausea Over-the-counter cough and cold medicines May alternate Tylenol with ibuprofen Go to the ER if your shortness of breath or fatigue worsen    ED Prescriptions    Medication Sig Dispense Auth. Provider   ondansetron (ZOFRAN) 4 MG tablet Take 1-2 tablets (4-8 mg total) by mouth every 6 (six) hours. As needed nausea 12 tablet Eustace Moore, MD     PDMP not reviewed this encounter.   Eustace Moore, MD 05/27/20 [phone]

## 2020-05-27 NOTE — ED Triage Notes (Signed)
Pt covid positive. S/s started x 8 days ago.  Pt is having extreme fatigue, high fevers and nausea

## 2020-05-27 NOTE — Discharge Instructions (Addendum)
Continue to rest Try to drink more fluids I am prescribing Zofran for nausea Over-the-counter cough and cold medicines May alternate Tylenol with ibuprofen Go to the ER if your shortness of breath or fatigue worsen

## 2020-05-28 ENCOUNTER — Other Ambulatory Visit: Payer: Self-pay | Admitting: Family Medicine

## 2020-05-28 ENCOUNTER — Telehealth: Payer: Self-pay

## 2020-05-28 MED ORDER — LEVOFLOXACIN 500 MG PO TABS: 500.0000 mg | ORAL_TABLET | Freq: Every day | ORAL | 0 refills | Status: DC

## 2020-05-28 NOTE — Telephone Encounter (Signed)
Spoke with pt, she is aware of the medication sent to the pharmacy

## 2020-05-28 NOTE — Telephone Encounter (Signed)
I will send in levaquin.  However, pneumonia is likely due to covid itself and antibiotic will not fight covid.

## 2020-08-22 ENCOUNTER — Other Ambulatory Visit: Payer: Self-pay | Admitting: Obstetrics and Gynecology

## 2020-08-22 DIAGNOSIS — M8580 Other specified disorders of bone density and structure, unspecified site: Secondary | ICD-10-CM

## 2020-09-26 ENCOUNTER — Other Ambulatory Visit: Payer: Self-pay | Admitting: Obstetrics and Gynecology

## 2020-09-26 DIAGNOSIS — M8580 Other specified disorders of bone density and structure, unspecified site: Secondary | ICD-10-CM

## 2020-11-19 ENCOUNTER — Encounter: Payer: 59 | Admitting: Family Medicine

## 2020-11-19 ENCOUNTER — Encounter: Payer: 59 | Admitting: Nurse Practitioner

## 2020-11-27 ENCOUNTER — Encounter: Payer: 59 | Admitting: Family Medicine

## 2020-12-25 ENCOUNTER — Ambulatory Visit (INDEPENDENT_AMBULATORY_CARE_PROVIDER_SITE_OTHER): Payer: 59 | Admitting: Family Medicine

## 2020-12-25 ENCOUNTER — Other Ambulatory Visit: Payer: Self-pay

## 2020-12-25 ENCOUNTER — Encounter: Payer: Self-pay | Admitting: Family Medicine

## 2020-12-25 VITALS — BP 128/78 | HR 62 | Temp 98.7°F | Resp 16 | Ht 62.0 in | Wt 135.0 lb

## 2020-12-25 DIAGNOSIS — Z0000 Encounter for general adult medical examination without abnormal findings: Secondary | ICD-10-CM | POA: Diagnosis not present

## 2020-12-25 NOTE — Progress Notes (Signed)
Subjective:    Patient ID: Megan Rodriguez, female    DOB: 27-Feb-1961, 60 y.o.   MRN: [id number]  HPI Patient is a very pleasant 60 year old Caucasian female.  She gets her Pap smear performed at her gynecologist.  She schedules her own mammogram in October.  Her last colonoscopy was in 2014 per her report.  She is not due again until 2024.  She does have a history of osteopenia and therefore she is due for repeat bone density test.  She is not currently taking calcium or vitamin D.  She is due for shingles vaccine along with a flu shot and COVID vaccination.  We discussed all 3 of these today and they were recommended to her.  Otherwise she is doing well with no concerns. Past Medical History:  Diagnosis Date   White coat syndrome without diagnosis of hypertension 11/14/2019   Past Surgical History:  Procedure Laterality Date   BREAST BIOPSY Left 2008   TUBAL LIGATION  1987   Current Outpatient Medications on File Prior to Visit  Medication Sig Dispense Refill   valACYclovir (VALTREX) 1000 MG tablet Take 1,000 mg by mouth daily as needed. (Patient not taking: Reported on 12/25/2020)     No current facility-administered medications on file prior to visit.   No Known Allergies Social History   Socioeconomic History   Marital status: Married    Spouse name: Not on file   Number of children: Not on file   Years of education: Not on file   Highest education level: Not on file  Occupational History   Not on file  Tobacco Use   Smoking status: Never   Smokeless tobacco: Never  Substance and Sexual Activity   Alcohol use: Yes   Drug use: Never   Sexual activity: Yes  Other Topics Concern   Not on file  Social History Narrative   Not on file   Social Determinants of Health   Financial Resource Strain: Not on file  Food Insecurity: Not on file  Transportation Needs: Not on file  Physical Activity: Not on file  Stress: Not on file  Social Connections: Not on file  Intimate  Partner Violence: Not on file   Family History  Problem Relation Age of Onset   Stroke Mother        past smoker   Cancer Maternal Grandmother    Heart disease Maternal Grandfather        MI--- at older age   Diabetes Paternal Grandmother       Review of Systems  All other systems reviewed and are negative.     Objective:   Physical Exam Vitals reviewed.  Constitutional:      General: She is not in acute distress.    Appearance: Normal appearance. She is normal weight. She is not ill-appearing, toxic-appearing or diaphoretic.  HENT:     Head: Normocephalic and atraumatic.     Right Ear: Tympanic membrane and ear canal normal. There is no impacted cerumen.     Left Ear: Tympanic membrane and ear canal normal. There is no impacted cerumen.     Nose: Nose normal. No congestion or rhinorrhea.     Mouth/Throat:     Mouth: Mucous membranes are moist.     Pharynx: Oropharynx is clear. No oropharyngeal exudate or posterior oropharyngeal erythema.  Eyes:     Conjunctiva/sclera: Conjunctivae normal.     Pupils: Pupils are equal, round, and reactive to light.  Neck:     Vascular:  No carotid bruit.  Cardiovascular:     Rate and Rhythm: Normal rate and regular rhythm.     Pulses: Normal pulses.     Heart sounds: Normal heart sounds. No murmur heard.   No friction rub. No gallop.  Pulmonary:     Effort: Pulmonary effort is normal. No respiratory distress.     Breath sounds: Normal breath sounds. No stridor. No wheezing, rhonchi or rales.  Abdominal:     General: Bowel sounds are normal. There is no distension.     Palpations: Abdomen is soft. There is no mass.     Tenderness: There is no abdominal tenderness. There is no right CVA tenderness, left CVA tenderness, guarding or rebound.     Hernia: No hernia is present.  Musculoskeletal:     Cervical back: Normal range of motion and neck supple. No rigidity or tenderness.     Right lower leg: No edema.     Left lower leg: No  edema.  Lymphadenopathy:     Cervical: No cervical adenopathy.  Skin:    General: Skin is warm.     Coloration: Skin is not jaundiced or pale.     Findings: No bruising, erythema, lesion or rash.  Neurological:     General: No focal deficit present.     Mental Status: She is alert and oriented to person, place, and time. Mental status is at baseline.     Cranial Nerves: No cranial nerve deficit.     Sensory: No sensory deficit.     Motor: No weakness.     Coordination: Coordination normal.     Gait: Gait normal.     Deep Tendon Reflexes: Reflexes normal.  Psychiatric:        Mood and Affect: Mood normal.        Behavior: Behavior normal.        Thought Content: Thought content normal.        Judgment: Judgment normal.          Assessment & Plan:  General medical exam - Plan: CBC with Differential/Platelet, COMPLETE METABOLIC PANEL WITH GFR, Lipid panel  Patient's physical exam today is completely normal.  She will schedule her mammogram for October.  Her colonoscopy is due in 2 years.  I would like to schedule the patient for a bone density test and I recommended that she take 1200 mg a day of calcium and 1000 units a day of vitamin D.  I recommended the shingles vaccine.  Also recommended a seasonal flu shot and we discussed the pros and cons of a COVID vaccination.  The remainder of her preventative care is up-to-date and performed by her gynecologist.

## 2020-12-26 LAB — LIPID PANEL
Cholesterol: 224 mg/dL — ABNORMAL HIGH (ref <200)
HDL: 53 mg/dL (ref >=50)
LDL Cholesterol (Calc): 135 mg/dL (calc) — ABNORMAL HIGH
Non-HDL Cholesterol (Calc): 171 mg/dL (calc) — ABNORMAL HIGH (ref <130)
Total CHOL/HDL Ratio: 4.2 (calc) (ref <5.0)
Triglycerides: 215 mg/dL — ABNORMAL HIGH (ref <150)

## 2020-12-26 LAB — COMPLETE METABOLIC PANEL WITH GFR
AG Ratio: 1.7 (calc) (ref 1.0–2.5)
ALT: 20 U/L (ref 6–29)
AST: 15 U/L (ref 10–35)
Albumin: 4.7 g/dL (ref 3.6–5.1)
Alkaline phosphatase (APISO): 49 U/L (ref 37–153)
BUN: 13 mg/dL (ref 7–25)
CO2: 29 mmol/L (ref 20–32)
Calcium: 10 mg/dL (ref 8.6–10.4)
Chloride: 102 mmol/L (ref 98–110)
Creat: 0.79 mg/dL (ref 0.50–1.05)
Globulin: 2.8 g/dL (calc) (ref 1.9–3.7)
Glucose, Bld: 99 mg/dL (ref 65–99)
Potassium: 4.4 mmol/L (ref 3.5–5.3)
Sodium: 140 mmol/L (ref 135–146)
Total Bilirubin: 0.6 mg/dL (ref 0.2–1.2)
Total Protein: 7.5 g/dL (ref 6.1–8.1)
eGFR: 86 mL/min/{1.73_m2} (ref >=60)

## 2020-12-26 LAB — CBC WITH DIFFERENTIAL/PLATELET
Absolute Monocytes: 468 cells/uL (ref 200–950)
Basophils Absolute: 78 cells/uL (ref 0–200)
Basophils Relative: 1.2 %
Eosinophils Absolute: 78 cells/uL (ref 15–500)
Eosinophils Relative: 1.2 %
HCT: 41.9 % (ref 35.0–45.0)
Hemoglobin: 14.4 g/dL (ref 11.7–15.5)
Lymphs Abs: 1541 cells/uL (ref 850–3900)
MCH: 30.3 pg (ref 27.0–33.0)
MCHC: 34.4 g/dL (ref 32.0–36.0)
MCV: 88.2 fL (ref 80.0–100.0)
MPV: 10.5 fL (ref 7.5–12.5)
Monocytes Relative: 7.2 %
Neutro Abs: 4336 cells/uL (ref 1500–7800)
Neutrophils Relative %: 66.7 %
Platelets: 286 10*3/uL (ref 140–400)
RBC: 4.75 10*6/uL (ref 3.80–5.10)
RDW: 12.6 % (ref 11.0–15.0)
Total Lymphocyte: 23.7 %
WBC: 6.5 10*3/uL (ref 3.8–10.8)

## 2021-01-04 ENCOUNTER — Telehealth: Payer: 59 | Admitting: Nurse Practitioner

## 2021-01-10 ENCOUNTER — Encounter: Payer: Self-pay | Admitting: *Deleted

## 2021-02-19 ENCOUNTER — Other Ambulatory Visit: Payer: Self-pay | Admitting: Family Medicine

## 2021-02-19 DIAGNOSIS — Z1231 Encounter for screening mammogram for malignant neoplasm of breast: Secondary | ICD-10-CM

## 2021-03-11 ENCOUNTER — Other Ambulatory Visit: Payer: Self-pay

## 2021-03-11 ENCOUNTER — Ambulatory Visit
Admission: RE | Admit: 2021-03-11 | Discharge: 2021-03-11 | Disposition: A | Discharge status: Home / Self Care | Payer: 59 | Source: Ambulatory Visit | Attending: Obstetrics and Gynecology | Admitting: Obstetrics and Gynecology

## 2021-03-11 DIAGNOSIS — M8580 Other specified disorders of bone density and structure, unspecified site: Secondary | ICD-10-CM

## 2021-03-12 ENCOUNTER — Encounter: Payer: Self-pay | Admitting: Family Medicine

## 2021-03-26 ENCOUNTER — Other Ambulatory Visit: Payer: Self-pay

## 2021-03-26 ENCOUNTER — Ambulatory Visit
Admission: RE | Admit: 2021-03-26 | Discharge: 2021-03-26 | Disposition: A | Discharge status: Home / Self Care | Payer: 59 | Source: Ambulatory Visit | Attending: Family Medicine | Admitting: Family Medicine

## 2021-03-26 DIAGNOSIS — Z1231 Encounter for screening mammogram for malignant neoplasm of breast: Secondary | ICD-10-CM

## 2022-02-21 ENCOUNTER — Other Ambulatory Visit: Payer: Self-pay | Admitting: Obstetrics and Gynecology

## 2022-02-21 DIAGNOSIS — Z1231 Encounter for screening mammogram for malignant neoplasm of breast: Secondary | ICD-10-CM

## 2022-03-27 ENCOUNTER — Ambulatory Visit
Admission: RE | Admit: 2022-03-27 | Discharge: 2022-03-27 | Disposition: A | Discharge status: Home / Self Care | Payer: 59 | Source: Ambulatory Visit | Attending: Obstetrics and Gynecology | Admitting: Obstetrics and Gynecology

## 2022-03-27 DIAGNOSIS — Z1231 Encounter for screening mammogram for malignant neoplasm of breast: Secondary | ICD-10-CM

## 2022-08-14 ENCOUNTER — Encounter: Payer: Self-pay | Admitting: Family Medicine

## 2022-08-14 ENCOUNTER — Ambulatory Visit (INDEPENDENT_AMBULATORY_CARE_PROVIDER_SITE_OTHER): Payer: 59 | Admitting: Family Medicine

## 2022-08-14 VITALS — BP 142/86 | HR 82 | Temp 98.8°F | Ht 62.0 in | Wt 135.6 lb

## 2022-08-14 DIAGNOSIS — Z0000 Encounter for general adult medical examination without abnormal findings: Secondary | ICD-10-CM | POA: Diagnosis not present

## 2022-08-14 DIAGNOSIS — Z23 Encounter for immunization: Secondary | ICD-10-CM

## 2022-08-14 DIAGNOSIS — Z1211 Encounter for screening for malignant neoplasm of colon: Secondary | ICD-10-CM | POA: Diagnosis not present

## 2022-08-14 NOTE — Addendum Note (Signed)
Addended by: Chriss Driver on: 08/14/2022 09:38 AM   Modules accepted: Orders

## 2022-08-14 NOTE — Progress Notes (Signed)
Subjective:    Patient ID: Megan Rodriguez, female    DOB: 03/06/1961, 62 y.o.   MRN: [id number]  HPI Patient is a very pleasant 62 year old Caucasian female.  She gets her Pap smear performed at her gynecologist.  Her blood pressure is elevated today at 142/86 however she feels that she has whitecoat syndrome.  She recently was at her gynecologist and her blood pressure was excellent.  She watches it at home and her systolic blood pressures typically in the 120 range.  She states that she would like to watch it closer at home.  I believe did speak to [facility] elevated today.  She is due for the shingles vaccine which we will give her here.  We discussed COVID and RSV but she is very low risk for these.  Her mammogram was performed in November and was normal.  She recently went to a dermatologist and had a full body scan.  Her colonoscopy was 10 years ago so she is due for this.  Her sister had polyps and she does have a history of colon cancer in her family so we decided to do the colonoscopy versus Cologuard.  She had a bone density test 2 years ago that showed mild osteopenia.  She is taking calcium and vitamin D. Past Medical History:  Diagnosis Date   White coat syndrome without diagnosis of hypertension 11/14/2019   Past Surgical History:  Procedure Laterality Date   BREAST BIOPSY Left 2008   TUBAL LIGATION  1987   Current Outpatient Medications on File Prior to Visit  Medication Sig Dispense Refill   valACYclovir (VALTREX) 1000 MG tablet Take 1,000 mg by mouth daily as needed. (Patient not taking: Reported on 12/25/2020)     No current facility-administered medications on file prior to visit.   No Known Allergies Social History   Socioeconomic History   Marital status: Married    Spouse name: Not on file   Number of children: Not on file   Years of education: Not on file   Highest education level: Not on file  Occupational History   Not on file  Tobacco Use   Smoking status: Never    Smokeless tobacco: Never  Substance and Sexual Activity   Alcohol use: Yes   Drug use: Never   Sexual activity: Yes  Other Topics Concern   Not on file  Social History Narrative   Not on file   Social Determinants of Health   Financial Resource Strain: Not on file  Food Insecurity: Not on file  Transportation Needs: Not on file  Physical Activity: Not on file  Stress: Not on file  Social Connections: Not on file  Intimate Partner Violence: Not on file   Family History  Problem Relation Age of Onset   Stroke Mother        past smoker   Breast cancer Daughter    Cancer Maternal Grandmother    Heart disease Maternal Grandfather        MI--- at older age   Diabetes Paternal Grandmother       Review of Systems  All other systems reviewed and are negative.      Objective:   Physical Exam Vitals reviewed.  Constitutional:      General: She is not in acute distress.    Appearance: Normal appearance. She is normal weight. She is not ill-appearing, toxic-appearing or diaphoretic.  HENT:     Head: Normocephalic and atraumatic.     Right Ear: Tympanic membrane  and ear canal normal. There is no impacted cerumen.     Left Ear: Tympanic membrane and ear canal normal. There is no impacted cerumen.     Nose: Nose normal. No congestion or rhinorrhea.     Mouth/Throat:     Mouth: Mucous membranes are moist.     Pharynx: Oropharynx is clear. No oropharyngeal exudate or posterior oropharyngeal erythema.  Eyes:     Conjunctiva/sclera: Conjunctivae normal.     Pupils: Pupils are equal, round, and reactive to light.  Neck:     Vascular: No carotid bruit.  Cardiovascular:     Rate and Rhythm: Normal rate and regular rhythm.     Pulses: Normal pulses.     Heart sounds: Normal heart sounds. No murmur heard.    No friction rub. No gallop.  Pulmonary:     Effort: Pulmonary effort is normal. No respiratory distress.     Breath sounds: Normal breath sounds. No stridor. No  wheezing, rhonchi or rales.  Abdominal:     General: Bowel sounds are normal. There is no distension.     Palpations: Abdomen is soft. There is no mass.     Tenderness: There is no abdominal tenderness. There is no right CVA tenderness, left CVA tenderness, guarding or rebound.     Hernia: No hernia is present.  Musculoskeletal:     Cervical back: Normal range of motion and neck supple. No rigidity or tenderness.     Right lower leg: No edema.     Left lower leg: No edema.  Lymphadenopathy:     Cervical: No cervical adenopathy.  Skin:    General: Skin is warm.     Coloration: Skin is not jaundiced or pale.     Findings: No bruising, erythema, lesion or rash.  Neurological:     General: No focal deficit present.     Mental Status: She is alert and oriented to person, place, and time. Mental status is at baseline.     Cranial Nerves: No cranial nerve deficit.     Sensory: No sensory deficit.     Motor: No weakness.     Coordination: Coordination normal.     Gait: Gait normal.     Deep Tendon Reflexes: Reflexes normal.  Psychiatric:        Mood and Affect: Mood normal.        Behavior: Behavior normal.        Thought Content: Thought content normal.        Judgment: Judgment normal.           Assessment & Plan:  General medical exam - Plan: CBC with Differential/Platelet, COMPLETE METABOLIC PANEL WITH GFR, Lipid panel, HM COLONOSCOPY  Screen for colon cancer - Plan: HM COLONOSCOPY  Colon cancer screening - Plan: Ambulatory referral to Gastroenterology Patient's physical exam today is completely normal except for her blood pressure.  I believe that this is fictitiously elevated.  She is going to watch it closely at home and let me know what her values are.  I will schedule her for a colonoscopy.  Her Pap smear and her mammogram are up-to-date.  We decided to do a bone density test next year.  She will continue calcium and vitamin D.  She received the shingles vaccine today.  We  discussed COVID and RSV vaccinations.  Check CBC, CMP, and a fasting lipid panel

## 2022-08-15 LAB — CBC WITH DIFFERENTIAL/PLATELET
Absolute Monocytes: 388 cells/uL (ref 200–950)
Basophils Absolute: 71 cells/uL (ref 0–200)
Basophils Relative: 1.4 %
Eosinophils Absolute: 51 cells/uL (ref 15–500)
Eosinophils Relative: 1 %
HCT: 43.1 % (ref 35.0–45.0)
Hemoglobin: 14.6 g/dL (ref 11.7–15.5)
Lymphs Abs: 1311 cells/uL (ref 850–3900)
MCH: 29.4 pg (ref 27.0–33.0)
MCHC: 33.9 g/dL (ref 32.0–36.0)
MCV: 86.9 fL (ref 80.0–100.0)
MPV: 10.5 fL (ref 7.5–12.5)
Monocytes Relative: 7.6 %
Neutro Abs: 3279 cells/uL (ref 1500–7800)
Neutrophils Relative %: 64.3 %
Platelets: 283 10*3/uL (ref 140–400)
RBC: 4.96 10*6/uL (ref 3.80–5.10)
RDW: 13.2 % (ref 11.0–15.0)
Total Lymphocyte: 25.7 %
WBC: 5.1 10*3/uL (ref 3.8–10.8)

## 2022-08-15 LAB — LIPID PANEL
Cholesterol: 216 mg/dL — ABNORMAL HIGH (ref <200)
HDL: 51 mg/dL (ref >=50)
LDL Cholesterol (Calc): 137 mg/dL (calc) — ABNORMAL HIGH
Non-HDL Cholesterol (Calc): 165 mg/dL (calc) — ABNORMAL HIGH (ref <130)
Total CHOL/HDL Ratio: 4.2 (calc) (ref <5.0)
Triglycerides: 151 mg/dL — ABNORMAL HIGH (ref <150)

## 2022-08-15 LAB — COMPLETE METABOLIC PANEL WITH GFR
AG Ratio: 1.8 (calc) (ref 1.0–2.5)
ALT: 24 U/L (ref 6–29)
AST: 19 U/L (ref 10–35)
Albumin: 4.8 g/dL (ref 3.6–5.1)
Alkaline phosphatase (APISO): 56 U/L (ref 37–153)
BUN: 16 mg/dL (ref 7–25)
CO2: 27 mmol/L (ref 20–32)
Calcium: 10.1 mg/dL (ref 8.6–10.4)
Chloride: 103 mmol/L (ref 98–110)
Creat: 0.78 mg/dL (ref 0.50–1.05)
Globulin: 2.6 g/dL (calc) (ref 1.9–3.7)
Glucose, Bld: 106 mg/dL — ABNORMAL HIGH (ref 65–99)
Potassium: 4.6 mmol/L (ref 3.5–5.3)
Sodium: 140 mmol/L (ref 135–146)
Total Bilirubin: 0.8 mg/dL (ref 0.2–1.2)
Total Protein: 7.4 g/dL (ref 6.1–8.1)
eGFR: 86 mL/min/{1.73_m2} (ref >=60)

## 2022-09-01 ENCOUNTER — Telehealth: Payer: Self-pay | Admitting: Gastroenterology

## 2022-09-01 NOTE — Telephone Encounter (Unsigned)
Error

## 2022-09-25 NOTE — Telephone Encounter (Signed)
Good morning Dr. Barron Alvine  The following patient was referred to [place] for a colonoscopy. She was a patient at [facility] but after her doctor retired she decided she would like to stay within the [facility] spectrum. Records are available under media tab. Please review and advise of scheduling. Thank you.

## 2022-10-01 ENCOUNTER — Encounter: Payer: Self-pay | Admitting: Gastroenterology

## 2022-10-01 NOTE — Telephone Encounter (Signed)
PT is calling to get an update on the transfer of care that was requested.

## 2022-10-01 NOTE — Telephone Encounter (Signed)
PT scheduled for direct colonoscopy PV RM 50 6/3 @1030am ; 6/27 1130am- colonoscopy

## 2022-10-27 ENCOUNTER — Ambulatory Visit (AMBULATORY_SURGERY_CENTER): Payer: 59

## 2022-10-27 VITALS — Ht 62.0 in | Wt 135.0 lb

## 2022-10-27 DIAGNOSIS — Z1211 Encounter for screening for malignant neoplasm of colon: Secondary | ICD-10-CM

## 2022-10-27 DIAGNOSIS — Z83719 Family history of colon polyps, unspecified: Secondary | ICD-10-CM

## 2022-10-27 MED ORDER — NA SULFATE-K SULFATE-MG SULF 17.5-3.13-1.6 GM/177ML PO SOLN: 1.0000 | Freq: Once | ORAL | 0 refills | Status: AC

## 2022-10-27 NOTE — Progress Notes (Signed)
No egg or soy allergy known to patient  No issues known to pt with past sedation with any surgeries or procedures Patient denies ever being told they had issues or difficulty with intubation  No FH of Malignant Hyperthermia Pt is not on diet pills Pt is not on  home 02  Pt is not on blood thinners  Pt denies issues with constipation  No A fib or A flutter Have any cardiac testing pending--co Pt instructed to use Singlecare.com or GoodRx for a price reduction on prep  Patient's chart reviewed by Cathlyn Parsons CNRA prior to previsit and patient appropriate for the [facility].  Previsit completed and red dot placed by patient's name on their procedure day (on provider's schedule).   Can ambulate without any assistance

## 2022-11-06 ENCOUNTER — Encounter: Payer: Self-pay | Admitting: Gastroenterology

## 2022-11-07 ENCOUNTER — Other Ambulatory Visit: Payer: 59

## 2022-11-07 DIAGNOSIS — E785 Hyperlipidemia, unspecified: Secondary | ICD-10-CM

## 2022-11-08 LAB — LIPID PANEL
Cholesterol: 209 mg/dL — ABNORMAL HIGH (ref <200)
HDL: 52 mg/dL (ref >=50)
LDL Cholesterol (Calc): 136 mg/dL (calc) — ABNORMAL HIGH
Non-HDL Cholesterol (Calc): 157 mg/dL (calc) — ABNORMAL HIGH (ref <130)
Total CHOL/HDL Ratio: 4 (calc) (ref <5.0)
Triglycerides: 99 mg/dL (ref <150)

## 2022-11-11 ENCOUNTER — Other Ambulatory Visit: Payer: Self-pay

## 2022-11-11 MED ORDER — ATORVASTATIN CALCIUM 10 MG PO TABS: 10.0000 mg | ORAL_TABLET | Freq: Every day | ORAL | 3 refills | Status: DC

## 2022-11-14 ENCOUNTER — Telehealth: Payer: Self-pay

## 2022-11-14 MED ORDER — ATORVASTATIN CALCIUM 10 MG PO TABS: 10.0000 mg | ORAL_TABLET | Freq: Every day | ORAL | 3 refills | Status: DC

## 2022-11-14 NOTE — Telephone Encounter (Signed)
Pt called in to ask that refill for this med atorvastatin (LIPITOR) 10 MG tablet be resent to [facility] on [address] In [place]. Pt stated that she would also like to now have all meds sent to this pharmacy from now on please.  LOV: 08/14/22 CPE  NEW PHARMACY: [facility] [address] [place]  CB: [phone]

## 2022-11-15 ENCOUNTER — Encounter: Payer: Self-pay | Admitting: Certified Registered Nurse Anesthetist

## 2022-11-17 ENCOUNTER — Other Ambulatory Visit: Payer: 59

## 2022-11-20 ENCOUNTER — Encounter: Payer: Self-pay | Admitting: Gastroenterology

## 2022-11-20 ENCOUNTER — Ambulatory Visit (AMBULATORY_SURGERY_CENTER): Payer: 59 | Admitting: Gastroenterology

## 2022-11-20 VITALS — BP 114/67 | HR 66 | Temp 98.9°F | Resp 13 | Ht 62.0 in | Wt 135.0 lb

## 2022-11-20 DIAGNOSIS — Z1211 Encounter for screening for malignant neoplasm of colon: Secondary | ICD-10-CM

## 2022-11-20 DIAGNOSIS — D122 Benign neoplasm of ascending colon: Secondary | ICD-10-CM

## 2022-11-20 DIAGNOSIS — K640 First degree hemorrhoids: Secondary | ICD-10-CM

## 2022-11-20 DIAGNOSIS — K5730 Diverticulosis of large intestine without perforation or abscess without bleeding: Secondary | ICD-10-CM

## 2022-11-20 DIAGNOSIS — Z83719 Family history of colon polyps, unspecified: Secondary | ICD-10-CM | POA: Diagnosis not present

## 2022-11-20 HISTORY — PX: COLONOSCOPY WITH PROPOFOL: SHX5780

## 2022-11-20 MED ORDER — SODIUM CHLORIDE 0.9 % IV SOLN
500.0000 mL | Freq: Once | INTRAVENOUS | Status: DC
Start: 2022-11-20 — End: 2022-11-20

## 2022-11-20 NOTE — Progress Notes (Signed)
Called to room to assist during endoscopic procedure.  Patient ID and intended procedure confirmed with present staff. Received instructions for my participation in the procedure from the performing physician.  

## 2022-11-20 NOTE — Progress Notes (Signed)
Report given to PACU, vss 

## 2022-11-20 NOTE — Progress Notes (Signed)
GASTROENTEROLOGY PROCEDURE H&P NOTE   Primary Care Physician: Donita Brooks, MD    Reason for Procedure:  Colon Cancer screening  Plan:    Colonoscopy  Patient is appropriate for endoscopic procedure(s) in the ambulatory ([facility]) setting.  The nature of the procedure, as well as the risks, benefits, and alternatives were carefully and thoroughly reviewed with the patient. Ample time for discussion and questions allowed. The patient understood, was satisfied, and agreed to proceed.     HPI: Megan Rodriguez is a 62 y.o. female who presents for colonoscopy for routine Colon Cancer screening.  No active GI symptoms.    Last colonoscopy was 07/07/2011 and notable for internal hemorrhoids, but otherwise normal.  Fhx notable for sisters x3 with colon polyps. Maternal aunt with colon cancer, diagnosed in her 89's.    Past Medical History:  Diagnosis Date   White coat syndrome without diagnosis of hypertension 11/14/2019    Past Surgical History:  Procedure Laterality Date   BREAST BIOPSY Left 2008   TUBAL LIGATION  1987    Prior to Admission medications   Medication Sig Start Date End Date Taking? Authorizing Provider  tretinoin (RETIN-A) 0.025 % cream SMARTSIG:Sparingly Topical Every Other Day 10/15/22  Yes [provider]  atorvastatin (LIPITOR) 10 MG tablet Take 1 tablet (10 mg total) by mouth daily. Patient not taking: Reported on 11/20/2022 11/14/22   Donita Brooks, MD  Calcium Citrate-Vitamin D (CITRACAL + D PO)     [provider]  Omega-3 Fatty Acids (FISH OIL) 1200 MG CAPS     [provider]    Current Outpatient Medications  Medication Sig Dispense Refill   tretinoin (RETIN-A) 0.025 % cream SMARTSIG:Sparingly Topical Every Other Day     atorvastatin (LIPITOR) 10 MG tablet Take 1 tablet (10 mg total) by mouth daily. (Patient not taking: Reported on 11/20/2022) 90 tablet 3   Calcium Citrate-Vitamin D (CITRACAL + D PO)      Omega-3 Fatty  Acids (FISH OIL) 1200 MG CAPS  (Patient not taking: Reported on 11/20/2022)     Current Facility-Administered Medications  Medication Dose Route Frequency Provider Last Rate Last Admin   0.9 %  sodium chloride infusion  500 mL Intravenous Once Brenda Samano V, DO        Allergies as of 11/20/2022   (No Known Allergies)    Family History  Problem Relation Age of Onset   Stroke Mother        past smoker   Colon polyps Sister    Colon polyps Sister    Colon cancer Maternal Aunt    Cancer Maternal Grandmother    Heart disease Maternal Grandfather        MI--- at older age   Diabetes Paternal Grandmother    Breast cancer Daughter    Esophageal cancer Neg Hx    Rectal cancer Neg Hx    Stomach cancer Neg Hx     Social History   Socioeconomic History   Marital status: Married    Spouse name: Not on file   Number of children: Not on file   Years of education: Not on file   Highest education level: Not on file  Occupational History   Not on file  Tobacco Use   Smoking status: Never   Smokeless tobacco: Never  Vaping Use   Vaping Use: Never used  Substance and Sexual Activity   Alcohol use: Yes    Comment: occas   Drug use: Never  Sexual activity: Yes  Other Topics Concern   Not on file  Social History Narrative   Not on file   Social Determinants of Health   Financial Resource Strain: Not on file  Food Insecurity: Not on file  Transportation Needs: Not on file  Physical Activity: Not on file  Stress: Not on file  Social Connections: Not on file  Intimate Partner Violence: Not on file    Physical Exam: Vital signs in last 24 hours: @BP  (!) 154/88   Pulse 88   Temp 98.9 F (37.2 C) (Skin)   Ht 5\' 2"  (1.575 m)   Wt 135 lb (61.2 kg)   LMP 05/18/2015 (Approximate)   SpO2 98%   BMI 24.69 kg/m  GEN: NAD EYE: Sclerae anicteric ENT: MMM CV: Non-tachycardic Pulm: CTA b/l GI: Soft, NT/ND NEURO:  Alert & Oriented x 3   Doristine Locks, DO [facility]   11/20/2022 12:07 PM

## 2022-11-20 NOTE — Progress Notes (Signed)
Pt's states no medical or surgical changes since previsit or office visit. 

## 2022-11-20 NOTE — Patient Instructions (Addendum)
Continue present medications. Await pathology results. Repeat colonoscopy in 5-10 years for surveillance based on pathology results. Return to GI office as needed  Please read over handout about polyps, diverticulosis and hemorrhoids   YOU HAD AN ENDOSCOPIC PROCEDURE TODAY AT THE [facility]:   Refer to the procedure report that was given to you for any specific questions about what was found during the examination.  If the procedure report does not answer your questions, please call your gastroenterologist to clarify.  If you requested that your care partner not be given the details of your procedure findings, then the procedure report has been included in a sealed envelope for you to review at your convenience later.  YOU SHOULD EXPECT: Some feelings of bloating in the abdomen. Passage of more gas than usual.  Walking can help get rid of the air that was put into your GI tract during the procedure and reduce the bloating. If you had a lower endoscopy (such as a colonoscopy or flexible sigmoidoscopy) you may notice spotting of blood in your stool or on the toilet paper. If you underwent a bowel prep for your procedure, you may not have a normal bowel movement for a few days.  Please Note:  You might notice some irritation and congestion in your nose or some drainage.  This is from the oxygen used during your procedure.  There is no need for concern and it should clear up in a day or so.  SYMPTOMS TO REPORT IMMEDIATELY:  Following lower endoscopy (colonoscopy or flexible sigmoidoscopy):  Excessive amounts of blood in the stool  Significant tenderness or worsening of abdominal pains  Swelling of the abdomen that is new, acute  Fever of 100F or higher  For urgent or emergent issues, a gastroenterologist can be reached at any hour by calling [phone]. Do not use MyChart messaging for urgent concerns.    DIET:  We do recommend a small meal at first, but then you may proceed  to your regular diet.  Drink plenty of fluids but you should avoid alcoholic beverages for 24 hours.  ACTIVITY:  You should plan to take it easy for the rest of today and you should NOT DRIVE or use heavy machinery until tomorrow (because of the sedation medicines used during the test).    FOLLOW UP: Our staff will call the number listed on your records the next business day following your procedure.  We will call around 7:15- 8:00 am to check on you and address any questions or concerns that you may have regarding the information given to you following your procedure. If we do not reach you, we will leave a message.     If any biopsies were taken you will be contacted by phone or by letter within the next 1-3 weeks.  Please call us at [phone] if you have not heard about the biopsies in 3 weeks.    SIGNATURES/CONFIDENTIALITY: You and/or your care partner have signed paperwork which will be entered into your electronic medical record.  These signatures attest to the fact that that the information above on your After Visit Summary has been reviewed and is understood.  Full responsibility of the confidentiality of this discharge information lies with you and/or your care-partner.

## 2022-11-20 NOTE — Progress Notes (Signed)
1215 Patient experiencing nausea and retching.  MD updated and Zofran 4 mg IV given, vss

## 2022-11-20 NOTE — Op Note (Signed)
[facility] Patient Name: Megan Rodriguez Procedure Date: 11/20/2022 12:03 PM MRN: [id number] Endoscopist: Doristine Locks , MD, [id number] Age: 62 Referring MD:  Date of Birth: 1961/04/14 Gender: Female Account #: [id number] Procedure:                Colonoscopy Indications:              Screening for colorectal malignant neoplasm (last                            colonoscopy was 10 years ago)                           Last colonoscopy was 07/07/2011 and notable for                            internal hemorrhoids, but otherwise normal.                           Fhx notable for sisters x3 with colon polyps.                            Maternal aunt with colon cancer, diagnosed in her                            37's. Medicines:                Monitored Anesthesia Care Procedure:                Pre-Anesthesia Assessment:                           - Prior to the procedure, a History and Physical                            was performed, and patient medications and                            allergies were reviewed. The patient's tolerance of                            previous anesthesia was also reviewed. The risks                            and benefits of the procedure and the sedation                            options and risks were discussed with the patient.                            All questions were answered, and informed consent                            was obtained. Prior Anticoagulants: The patient has  taken no anticoagulant or antiplatelet agents. ASA                            Grade Assessment: II - A patient with mild systemic                            disease. After reviewing the risks and benefits,                            the patient was deemed in satisfactory condition to                            undergo the procedure.                           After obtaining informed consent, the colonoscope                            was  passed under direct vision. Throughout the                            procedure, the patient's blood pressure, pulse, and                            oxygen saturations were monitored continuously. The                            [id number]-HQ190L Colonoscope [id number] was introduced                            through the anus and advanced to the the terminal                            ileum. The colonoscopy was performed without                            difficulty. The patient tolerated the procedure                            well. The quality of the bowel preparation was                            good. The terminal ileum, ileocecal valve,                            appendiceal orifice, and rectum were photographed. Scope In: 12:14:42 PM Scope Out: 12:25:47 PM Scope Withdrawal Time: 0 hours 8 minutes 47 seconds  Total Procedure Duration: 0 hours 11 minutes 5 seconds  Findings:                 The perianal and digital rectal examinations were                            normal.  A 3 mm polyp was found in the ascending colon. The                            polyp was sessile. The polyp was removed with a                            cold snare. Resection and retrieval were complete.                            Estimated blood loss was minimal.                           A few small-mouthed diverticula were found in the                            sigmoid colon.                           Non-bleeding internal hemorrhoids were found during                            retroflexion. The hemorrhoids were small.                           The terminal ileum appeared normal. Complications:            No immediate complications. Estimated Blood Loss:     Estimated blood loss was minimal. Impression:               - One 3 mm polyp in the ascending colon, removed                            with a cold snare. Resected and retrieved.                           - Diverticulosis in the sigmoid  colon.                           - Non-bleeding internal hemorrhoids.                           - The examined portion of the ileum was normal. Recommendation:           - Patient has a contact number available for                            emergencies. The signs and symptoms of potential                            delayed complications were discussed with the                            patient. Return to normal activities tomorrow.  Written discharge instructions were provided to the                            patient.                           - Resume previous diet.                           - Continue present medications.                           - Await pathology results.                           - Repeat colonoscopy in 5-10 years for surveillance                            based on pathology results.                           - Return to GI office PRN. Doristine Locks, MD 11/20/2022 12:29:43 PM

## 2022-11-21 ENCOUNTER — Telehealth: Payer: Self-pay

## 2022-11-21 NOTE — Telephone Encounter (Signed)
  Follow up Call-     11/20/2022   11:23 AM  Call back number  Post procedure Call Back phone  # [phone]  Permission to leave phone message Yes     Patient questions:  Do you have a fever, pain , or abdominal swelling? No. Pain Score  0 *  Have you tolerated food without any problems? Yes.    Have you been able to return to your normal activities? Yes.    Do you have any questions about your discharge instructions: Diet   No. Medications  No. Follow up visit  No.  Do you have questions or concerns about your Care? No.  Actions: * If pain score is 4 or above: No action needed, pain <4.

## 2023-02-02 ENCOUNTER — Other Ambulatory Visit: Payer: Self-pay | Admitting: Family Medicine

## 2023-02-02 ENCOUNTER — Telehealth: Payer: Self-pay

## 2023-02-02 MED ORDER — NIRMATRELVIR/RITONAVIR (PAXLOVID)TABLET: 3.0000 | ORAL_TABLET | Freq: Two times a day (BID) | ORAL | 0 refills | Status: AC

## 2023-02-02 NOTE — Telephone Encounter (Signed)
Called and spoke w/pt, is aware Rx sent to pharmacy. Per pt will pick up this afternoon.

## 2023-02-02 NOTE — Telephone Encounter (Signed)
Pt's husband came in stating that pt has tested positive for Covid and would like to know if pcp would send in Paxlovid for symptoms of headache, cough, congestion. Please advise.  Cb#: [phone]  Pharmacy:  [facility] [id number] - [place],  - [address] AT [facility] [address], [place] [address] Phone: [phone]  Fax: [phone]

## 2023-03-24 ENCOUNTER — Other Ambulatory Visit: Payer: Self-pay | Admitting: Family Medicine

## 2023-03-24 DIAGNOSIS — Z1231 Encounter for screening mammogram for malignant neoplasm of breast: Secondary | ICD-10-CM

## 2023-04-29 ENCOUNTER — Ambulatory Visit
Admission: RE | Admit: 2023-04-29 | Discharge: 2023-04-29 | Disposition: A | Discharge status: Home / Self Care | Payer: 59 | Source: Ambulatory Visit | Attending: Family Medicine | Admitting: Family Medicine

## 2023-04-29 DIAGNOSIS — Z1231 Encounter for screening mammogram for malignant neoplasm of breast: Secondary | ICD-10-CM

## 2023-09-25 IMAGING — MG MM DIGITAL SCREENING BILAT W/ TOMO AND CAD
8 series · 8 of 24 positions shown · non-contrast
Comparison: Previous exam(s).

CLINICAL DATA: Screening.

EXAM:
DIGITAL SCREENING BILATERAL MAMMOGRAM WITH TOMOSYNTHESIS AND CAD
TECHNIQUE: Bilateral screening digital craniocaudal and mediolateral oblique
mammograms were obtained. Bilateral screening digital breast
tomosynthesis was performed. The images were evaluated with
computer-aided detection.

[R MLO synth-2D]
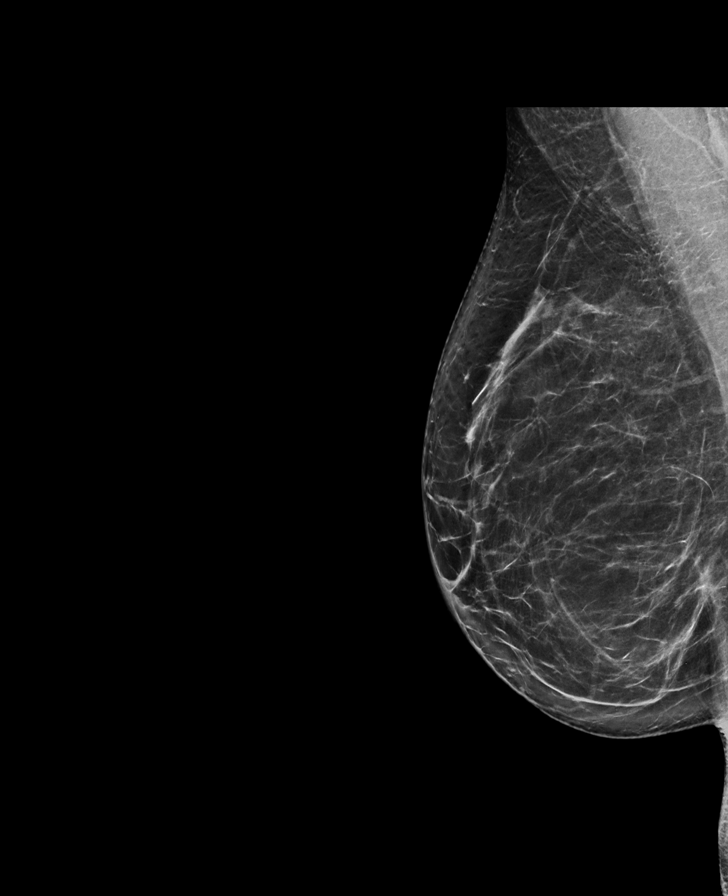

[L CC synth-2D]
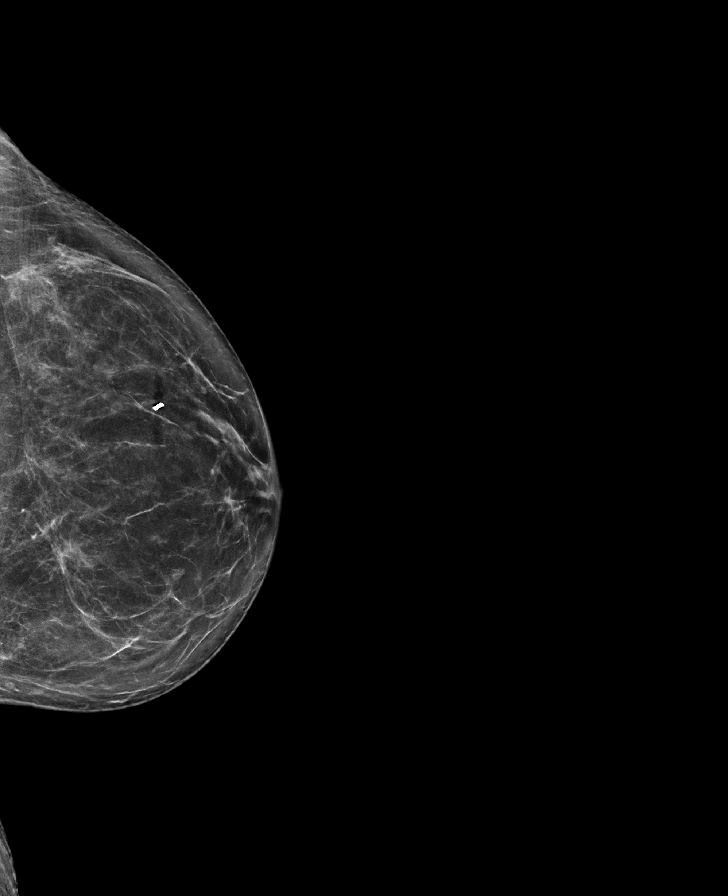

[R CC synth-2D]
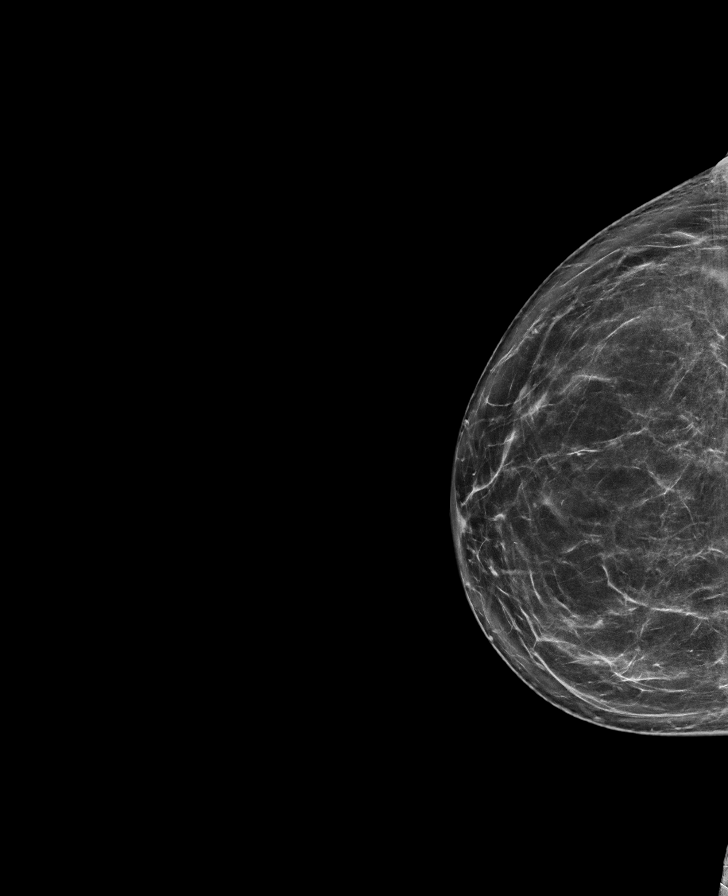

[L MLO synth-2D]
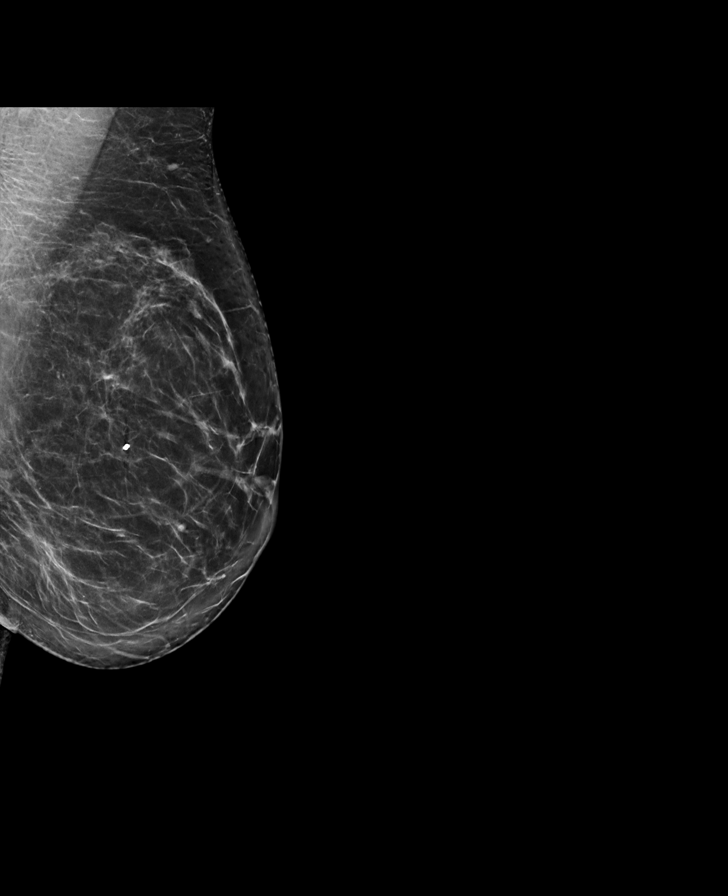

[L MLO tomo · tomo slice 41/82.0]
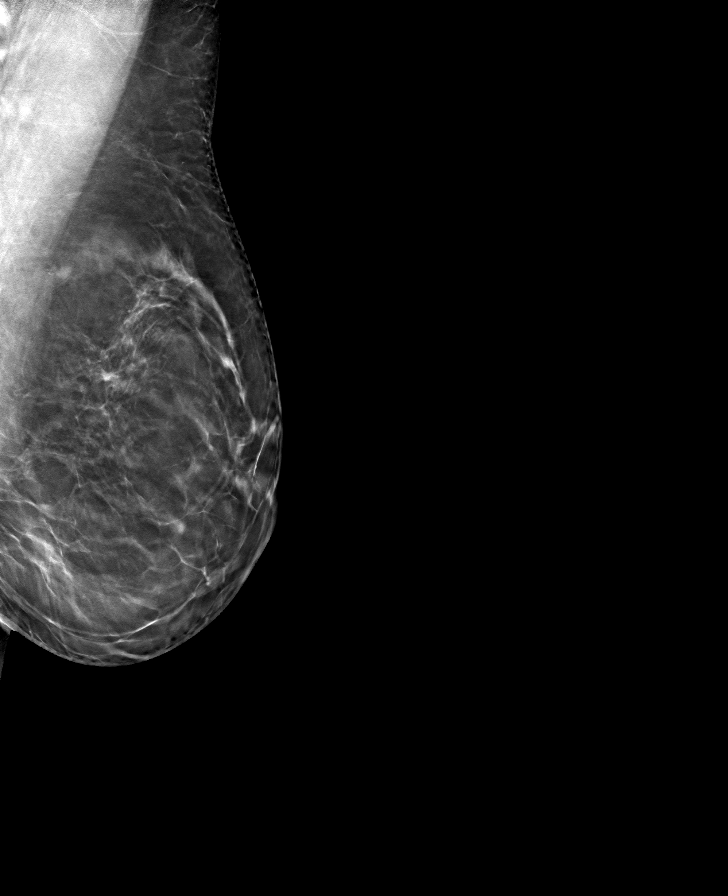

[L CC tomo · tomo slice 31/60.0]
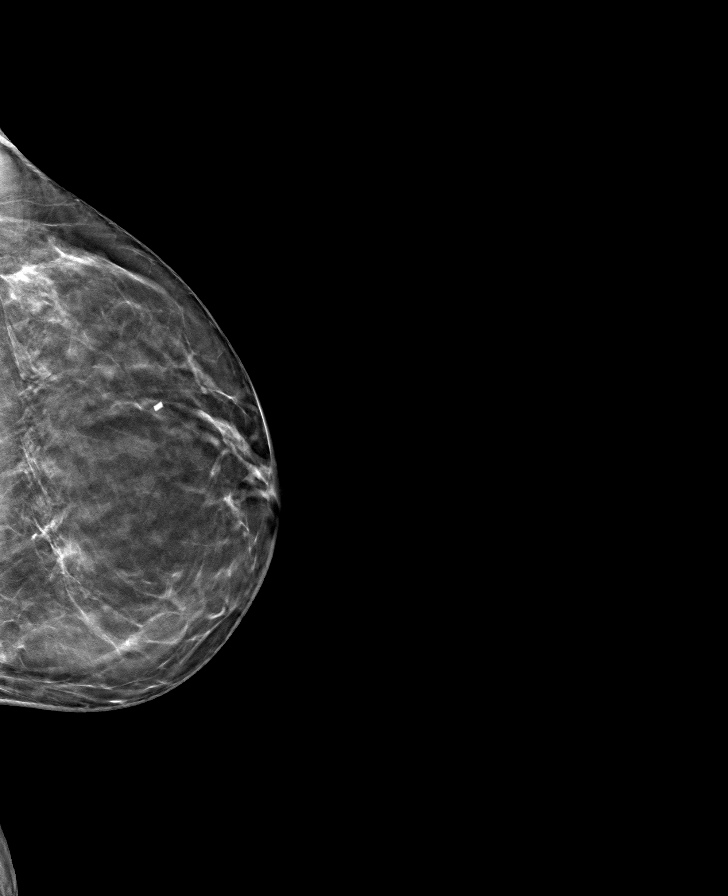

[R CC tomo · tomo slice 39/77.0]
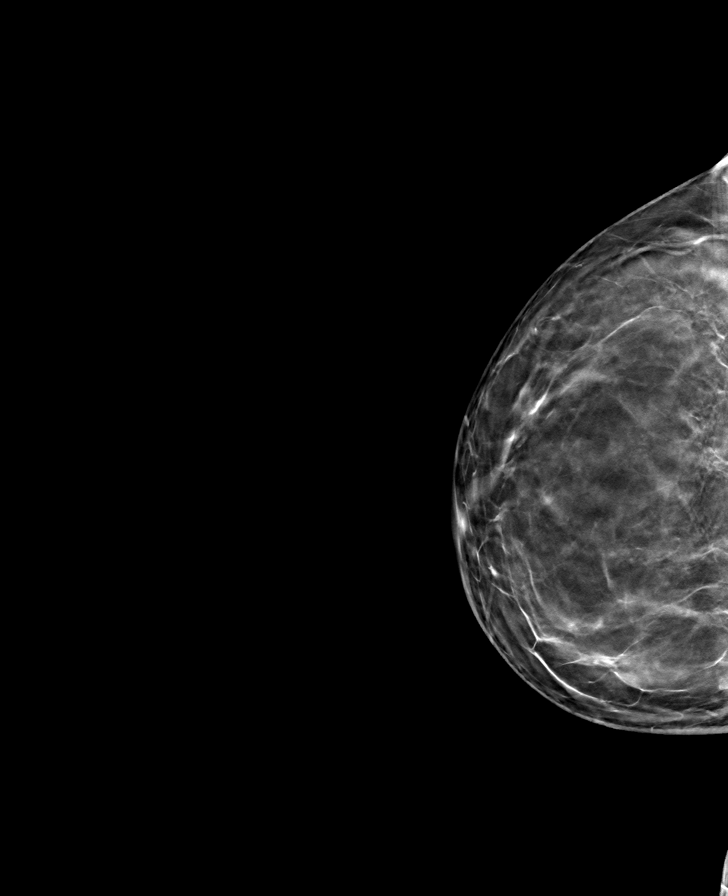

[R MLO tomo · tomo slice 42/83.0]
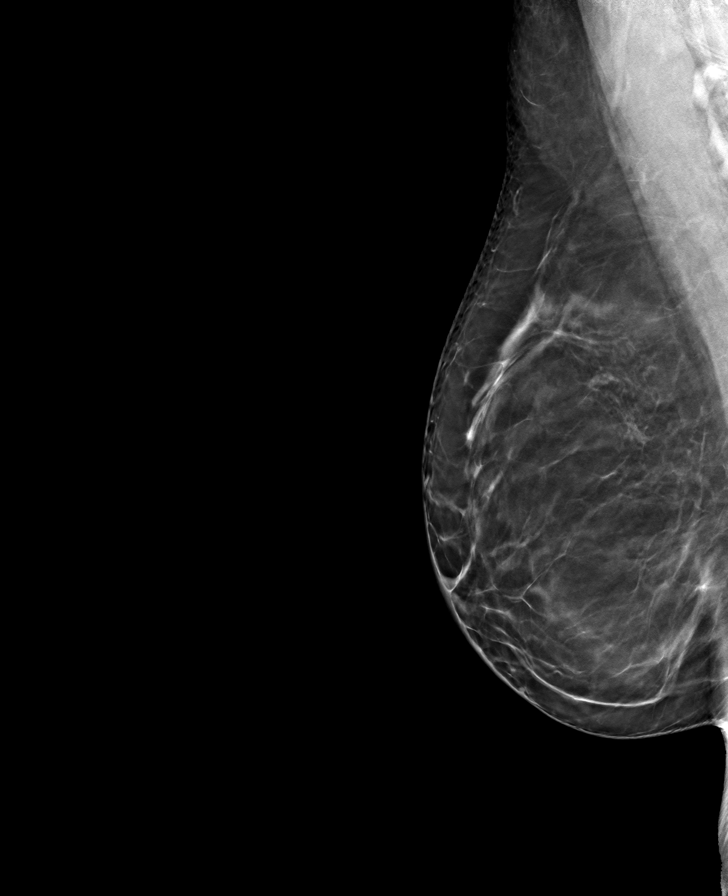

[8 of 24 positions shown; findings below may reference images not displayed]

ACR Breast Density Category b: There are scattered areas of
fibroglandular density.
FINDINGS: There are no findings suspicious for malignancy.
IMPRESSION: No mammographic evidence of malignancy. A result letter of this
screening mammogram will be mailed directly to the patient.

RECOMMENDATION:
Screening mammogram in one year. (Code:51-O-LD2)

BI-RADS CATEGORY  1: Negative.

## 2023-10-06 ENCOUNTER — Ambulatory Visit: Admitting: Family Medicine

## 2023-10-06 VITALS — BP 122/76 | HR 73 | Temp 98.1°F | Ht 62.0 in | Wt 136.0 lb

## 2023-10-06 DIAGNOSIS — Z0001 Encounter for general adult medical examination with abnormal findings: Secondary | ICD-10-CM | POA: Diagnosis not present

## 2023-10-06 DIAGNOSIS — Z23 Encounter for immunization: Secondary | ICD-10-CM | POA: Diagnosis not present

## 2023-10-06 DIAGNOSIS — E7800 Pure hypercholesterolemia, unspecified: Secondary | ICD-10-CM | POA: Diagnosis not present

## 2023-10-06 DIAGNOSIS — Z0000 Encounter for general adult medical examination without abnormal findings: Secondary | ICD-10-CM

## 2023-10-06 LAB — CBC WITH DIFFERENTIAL/PLATELET
Absolute Lymphocytes: 1775 {cells}/uL (ref 850–3900)
Absolute Monocytes: 524 {cells}/uL (ref 200–950)
Basophils Absolute: 61 {cells}/uL (ref 0–200)
Basophils Relative: 0.9 %
Eosinophils Absolute: 88 {cells}/uL (ref 15–500)
Eosinophils Relative: 1.3 %
HCT: 43.8 % (ref 35.0–45.0)
Hemoglobin: 15.4 g/dL (ref 11.7–15.5)
MCH: 30.9 pg (ref 27.0–33.0)
MCHC: 35.2 g/dL (ref 32.0–36.0)
MCV: 87.8 fL (ref 80.0–100.0)
MPV: 10.4 fL (ref 7.5–12.5)
Monocytes Relative: 7.7 %
Neutro Abs: 4352 {cells}/uL (ref 1500–7800)
Neutrophils Relative %: 64 %
Platelets: 310 10*3/uL (ref 140–400)
RBC: 4.99 10*6/uL (ref 3.80–5.10)
RDW: 12.6 % (ref 11.0–15.0)
Total Lymphocyte: 26.1 %
WBC: 6.8 10*3/uL (ref 3.8–10.8)

## 2023-10-06 LAB — COMPREHENSIVE METABOLIC PANEL WITH GFR
AG Ratio: 1.7 (calc) (ref 1.0–2.5)
ALT: 22 U/L (ref 6–29)
AST: 20 U/L (ref 10–35)
Albumin: 4.7 g/dL (ref 3.6–5.1)
Alkaline phosphatase (APISO): 58 U/L (ref 37–153)
BUN: 17 mg/dL (ref 7–25)
CO2: 30 mmol/L (ref 20–32)
Calcium: 10 mg/dL (ref 8.6–10.4)
Chloride: 102 mmol/L (ref 98–110)
Creat: 0.8 mg/dL (ref 0.50–1.05)
Globulin: 2.8 g/dL (ref 1.9–3.7)
Glucose, Bld: 94 mg/dL (ref 65–99)
Potassium: 4.4 mmol/L (ref 3.5–5.3)
Sodium: 140 mmol/L (ref 135–146)
Total Bilirubin: 0.9 mg/dL (ref 0.2–1.2)
Total Protein: 7.5 g/dL (ref 6.1–8.1)
eGFR: 83 mL/min/{1.73_m2} (ref >=60)

## 2023-10-06 LAB — LIPID PANEL
Cholesterol: 144 mg/dL (ref <200)
HDL: 48 mg/dL — ABNORMAL LOW (ref >=50)
LDL Cholesterol (Calc): 75 mg/dL
Non-HDL Cholesterol (Calc): 96 mg/dL (ref <130)
Total CHOL/HDL Ratio: 3 (calc) (ref <5.0)
Triglycerides: 131 mg/dL (ref <150)

## 2023-10-06 NOTE — Progress Notes (Signed)
 Subjective:    Patient ID: Megan Rodriguez, female    DOB: 12-15-1960, 63 y.o.   MRN: [id number]  HPI Patient is a very pleasant 63 year old Caucasian female here today for complete physical exam.  She is currently on statin therapy for hyperlipidemia.  She is due to recheck her cholesterol.  She is also due for the pneumonia vaccine, the second shingles vaccine, and a tetanus shot.  She elects to receive a second shingles vaccine today.  Her colonoscopy is due in 2031.  She had a mammogram in December that is up-to-date.  She sees her gynecologist for her Pap smear.  Her blood pressure has been well-controlled at home recently. Past Medical History:  Diagnosis Date   White coat syndrome without diagnosis of hypertension 11/14/2019   Past Surgical History:  Procedure Laterality Date   BREAST BIOPSY Left 2008   COLONOSCOPY  2013   [facility], normal   COLONOSCOPY WITH PROPOFOL   11/20/2022   [facility]   TUBAL LIGATION  1987   Current Outpatient Medications on File Prior to Visit  Medication Sig Dispense Refill   atorvastatin  (LIPITOR) 10 MG tablet Take 1 tablet (10 mg total) by mouth daily. 90 tablet 3   Calcium  Citrate-Vitamin D  (CITRACAL + D PO)      tretinoin (RETIN-A) 0.025 % cream SMARTSIG:Sparingly Topical Every Other Day     No current facility-administered medications on file prior to visit.   No Known Allergies Social History   Socioeconomic History   Marital status: Married    Spouse name: Not on file   Number of children: Not on file   Years of education: Not on file   Highest education level: Associate degree: occupational, Scientist, product/process development, or vocational program  Occupational History   Not on file  Tobacco Use   Smoking status: Never   Smokeless tobacco: Never  Vaping Use   Vaping status: Never Used  Substance and Sexual Activity   Alcohol use: Yes    Comment: occas   Drug use: Never   Sexual activity: Yes  Other Topics Concern   Not on file  Social History  Narrative   Not on file   Social Drivers of Health   Financial Resource Strain: Low Risk  (10/02/2023)   Overall Financial Resource Strain (CARDIA)    Difficulty of Paying Living Expenses: Not hard at all  Food Insecurity: No Food Insecurity (10/02/2023)   Hunger Vital Sign    Worried About Running Out of Food in the Last Year: Never true    Ran Out of Food in the Last Year: Never true  Transportation Needs: No Transportation Needs (10/02/2023)   PRAPARE - Administrator, Civil Service (Medical): No    Lack of Transportation (Non-Medical): No  Physical Activity: Sufficiently Active (10/02/2023)   Exercise Vital Sign    Days of Exercise per Week: 4 days    Minutes of Exercise per Session: 60 min  Stress: No Stress Concern Present (10/02/2023)   [employer] of Occupational Health - Occupational Stress Questionnaire    Feeling of Stress : Not at all  Social Connections: Moderately Integrated (10/02/2023)   Social Connection and Isolation Panel [NHANES]    Frequency of Communication with Friends and Family: More than three times a week    Frequency of Social Gatherings with Friends and Family: More than three times a week    Attends Religious Services: More than 4 times per year    Active Member of [employer]  or Organizations: No    Attends Engineer, structural: Not on file    Marital Status: Married  Catering manager Violence: Not on file   Family History  Problem Relation Age of Onset   Stroke Mother        past smoker   Colon polyps Sister    Colon polyps Sister    Colon cancer Maternal Aunt    Cancer Maternal Grandmother    Heart disease Maternal Grandfather        MI--- at older age   Diabetes Paternal Grandmother    Breast cancer Daughter    Esophageal cancer Neg Hx    Rectal cancer Neg Hx    Stomach cancer Neg Hx       Review of Systems  All other systems reviewed and are negative.      Objective:   Physical Exam Vitals reviewed.   Constitutional:      General: She is not in acute distress.    Appearance: Normal appearance. She is normal weight. She is not ill-appearing, toxic-appearing or diaphoretic.  HENT:     Head: Normocephalic and atraumatic.     Right Ear: Tympanic membrane and ear canal normal. There is no impacted cerumen.     Left Ear: Tympanic membrane and ear canal normal. There is no impacted cerumen.     Nose: Nose normal. No congestion or rhinorrhea.     Mouth/Throat:     Mouth: Mucous membranes are moist.     Pharynx: Oropharynx is clear. No oropharyngeal exudate or posterior oropharyngeal erythema.  Eyes:     Conjunctiva/sclera: Conjunctivae normal.     Pupils: Pupils are equal, round, and reactive to light.  Neck:     Vascular: No carotid bruit.  Cardiovascular:     Rate and Rhythm: Normal rate and regular rhythm.     Pulses: Normal pulses.     Heart sounds: Normal heart sounds. No murmur heard.    No friction rub. No gallop.  Pulmonary:     Effort: Pulmonary effort is normal. No respiratory distress.     Breath sounds: Normal breath sounds. No stridor. No wheezing, rhonchi or rales.  Abdominal:     General: Bowel sounds are normal. There is no distension.     Palpations: Abdomen is soft. There is no mass.     Tenderness: There is no abdominal tenderness. There is no right CVA tenderness, left CVA tenderness, guarding or rebound.     Hernia: No hernia is present.  Musculoskeletal:     Cervical back: Normal range of motion and neck supple. No rigidity or tenderness.     Right lower leg: No edema.     Left lower leg: No edema.  Lymphadenopathy:     Cervical: No cervical adenopathy.  Skin:    General: Skin is warm.     Coloration: Skin is not jaundiced or pale.     Findings: No bruising, erythema, lesion or rash.  Neurological:     General: No focal deficit present.     Mental Status: She is alert and oriented to person, place, and time. Mental status is at baseline.     Cranial  Nerves: No cranial nerve deficit.     Sensory: No sensory deficit.     Motor: No weakness.     Coordination: Coordination normal.     Gait: Gait normal.     Deep Tendon Reflexes: Reflexes normal.  Psychiatric:        Mood and  Affect: Mood normal.        Behavior: Behavior normal.        Thought Content: Thought content normal.        Judgment: Judgment normal.           Assessment & Plan:  Pure hypercholesterolemia - Plan: CBC with Differential/Platelet, Comprehensive metabolic panel with GFR, Lipid panel  General medical exam Check CBC, CMP, and lipid panel SOB possible left lung.  The patient received her second dose of the shingles vaccine.  Also recommended the pneumonia vaccine and a tetanus shot.  Mammogram is up-to-date.  Colonoscopy is done in 2031.  Bone density will be due at age 60.  Defer Pap smear to her gynecologist.

## 2023-10-06 NOTE — Addendum Note (Signed)
 Addended by: Gillermo Lack K on: 10/06/2023 09:25 AM   Modules accepted: Orders

## 2023-10-08 ENCOUNTER — Ambulatory Visit: Payer: Self-pay | Admitting: Family Medicine

## 2023-11-26 ENCOUNTER — Other Ambulatory Visit: Payer: Self-pay | Admitting: Family Medicine

## 2024-03-18 ENCOUNTER — Other Ambulatory Visit: Payer: Self-pay | Admitting: Family Medicine

## 2024-03-18 DIAGNOSIS — Z1231 Encounter for screening mammogram for malignant neoplasm of breast: Secondary | ICD-10-CM

## 2024-05-03 ENCOUNTER — Ambulatory Visit

## 2024-05-31 ENCOUNTER — Ambulatory Visit
Admission: RE | Admit: 2024-05-31 | Discharge: 2024-05-31 | Disposition: A | Source: Ambulatory Visit | Attending: Family Medicine | Admitting: Family Medicine

## 2024-05-31 DIAGNOSIS — Z1231 Encounter for screening mammogram for malignant neoplasm of breast: Secondary | ICD-10-CM

## 2024-06-02 ENCOUNTER — Other Ambulatory Visit: Payer: Self-pay | Admitting: Family Medicine

## 2024-06-02 DIAGNOSIS — R928 Other abnormal and inconclusive findings on diagnostic imaging of breast: Secondary | ICD-10-CM

## 2024-06-08 ENCOUNTER — Inpatient Hospital Stay: Admission: RE | Admit: 2024-06-08 | Discharge: 2024-06-08 | Attending: Family Medicine | Admitting: Family Medicine

## 2024-06-08 ENCOUNTER — Other Ambulatory Visit: Payer: Self-pay | Admitting: Family Medicine

## 2024-06-08 DIAGNOSIS — N6311 Unspecified lump in the right breast, upper outer quadrant: Secondary | ICD-10-CM

## 2024-06-08 DIAGNOSIS — R928 Other abnormal and inconclusive findings on diagnostic imaging of breast: Secondary | ICD-10-CM

## 2024-06-09 ENCOUNTER — Ambulatory Visit
Admission: RE | Admit: 2024-06-09 | Discharge: 2024-06-09 | Disposition: A | Source: Ambulatory Visit | Attending: Family Medicine | Admitting: Family Medicine

## 2024-06-09 DIAGNOSIS — N6311 Unspecified lump in the right breast, upper outer quadrant: Secondary | ICD-10-CM

## 2024-06-09 HISTORY — PX: BREAST BIOPSY: SHX20

## 2024-06-10 LAB — SURGICAL PATHOLOGY

## 2024-06-14 ENCOUNTER — Telehealth: Payer: Self-pay | Admitting: *Deleted

## 2024-06-14 NOTE — Telephone Encounter (Signed)
 Spoke to patient to confirm upcoming morning [facility] clinic appointment on 1/28, paperwork will be sent via email  Gave location and time, also informed patient that the surgeon's office would be calling as well to get information from them similar to the packet that they will be receiving so make sure to do both.  Reminded patient that all providers will be coming to the clinic to see them HERE and if they had any questions to not hesitate to reach back out to myself or their navigators.

## 2024-06-17 ENCOUNTER — Encounter: Payer: Self-pay | Admitting: *Deleted

## 2024-06-17 DIAGNOSIS — C50411 Malignant neoplasm of upper-outer quadrant of right female breast: Secondary | ICD-10-CM | POA: Insufficient documentation

## 2024-06-18 NOTE — Progress Notes (Signed)
 " Radiation Oncology         [phone] ________________________________  Name: Megan Rodriguez        MRN: [id number]  Date of Service: 06/22/2024 DOB: 08-Jul-1960  RR:Eprxjmi, Butler DASEN, MD  Vanderbilt Ned, MD     REFERRING PHYSICIAN: Vanderbilt Ned, MD   DIAGNOSIS: The encounter diagnosis was Malignant neoplasm of upper-outer quadrant of right breast in female, estrogen receptor positive (HCC). C50.411, Z17.0    HISTORY OF PRESENT ILLNESS: Megan Rodriguez is a 64 y.o. female seen in consultation for radiation therapy.  She presented after a bilateral screening mammogram performed on 06/02/24 demonstrated a possible mass in the R breast, prompting further evaluation. A diagnostic R breast mammogram with tomosynthesis and targeted US  on 06/08/24 revealed an irregularly shaped, spiculated mass in the posterior UOQ of the R breast. Targeted US  demonstrated a corresponding 0.7 x 0.5 x 0.4 cm irregular hypoechoic mass with angular margins at the 10 o'clock position, 9 cm from the nipple. No suspicious axillary lymphadenopathy was identified. The findings were assessed as BI-RADS 4.   She subsequently underwent US -guided core needle biopsy of the R breast lesion on 06/09/24, with placement of a biopsy clip confirmed to be in the appropriate position on post-procedure mammography. Pathology demonstrated invasive ductal carcinoma with lobular features, overall Grade II, measuring 6 mm in greatest linear dimension. No LVSI or calcifications were identified. IHC revealed ER positivity (95% strong), PR negativity (0%), Her2 equivocal by IHC (2+) with subsequent FISH testing confirming Her2 negativity. Ki-67 was 15%.  She presents today to multidisciplinary breast clinic to meet with surgery, medical oncology and radiation oncology as well as PT, social work and genetics.   Estrogen exposure history:  Childbearing/breastfeeding:   Family history of cancer:  PREVIOUS RADIATION THERAPY: {EXAM;  YES/NO:19492::No}  AUTOIMMUNE DISEASE: {EXAM; YES/NO:19492::No}  MEDICAL DEVICES: {EXAM; YES/NO:19492::No}  PREGNANCY: {EXAM; YES/NO:19492::No}   PAST MEDICAL HISTORY:  Past Medical History:  Diagnosis Date   White coat syndrome without diagnosis of hypertension 11/14/2019       PAST SURGICAL HISTORY: Past Surgical History:  Procedure Laterality Date   BREAST BIOPSY Left 2008   BREAST BIOPSY Right 06/09/2024   US  RT BREAST BX W LOC DEV 1ST LESION IMG BX SPEC US  GUIDE 06/09/2024 [facility]   COLONOSCOPY  2013   [facility], normal   COLONOSCOPY WITH PROPOFOL   11/20/2022   [facility] at [facility]   TUBAL LIGATION  1987     FAMILY HISTORY:  Family History  Problem Relation Age of Onset   Stroke Mother        past smoker   Colon polyps Sister    Colon polyps Sister    Colon cancer Maternal Aunt    Cancer Maternal Grandmother    Heart disease Maternal Grandfather        MI--- at older age   Diabetes Paternal Grandmother    Breast cancer Daughter    Esophageal cancer Neg Hx    Rectal cancer Neg Hx    Stomach cancer Neg Hx      SOCIAL HISTORY:  reports that she has never smoked. She has never used smokeless tobacco. She reports current alcohol use. She reports that she does not use drugs.   ALLERGIES: Patient has no known allergies.   MEDICATIONS:  Current Outpatient Medications  Medication Sig Dispense Refill   atorvastatin  (LIPITOR) 10 MG tablet TAKE 1 TABLET(10 MG) BY MOUTH DAILY 90 tablet 3   Calcium  Citrate-Vitamin D  (CITRACAL + D PO)  tretinoin (RETIN-A) 0.025 % cream SMARTSIG:Sparingly Topical Every Other Day     No current facility-administered medications for this visit.     REVIEW OF SYSTEMS: The patient reports that they are doing well generally. Pertinent ROS per HPI and otherwise negative.      PHYSICAL EXAM:  Wt Readings from Last 3 Encounters:  10/06/23 136 lb (61.7 kg)  11/20/22 135 lb (61.2 kg)  10/27/22 135 lb (61.2 kg)    Temp Readings from Last 3 Encounters:  10/06/23 98.1 F (36.7 C)  11/20/22 98.9 F (37.2 C) (Skin)  08/14/22 98.8 F (37.1 C)   BP Readings from Last 3 Encounters:  10/06/23 122/76  11/20/22 114/67  08/14/22 (!) 142/86   Pulse Readings from Last 3 Encounters:  10/06/23 73  11/20/22 66  08/14/22 82    /10   Physical Exam   Breast Exam:  Well-healed lumpectomy/re-excision scar in the *** quadrant. No erythema, induration, or drainage. No palpable masses or nodularity at the surgical site. No skin changes, nipple inversion, or discharge.   ECOG = ***   LABORATORY DATA:  Lab Results  Component Value Date   WBC 6.8 10/06/2023   HGB 15.4 10/06/2023   HCT 43.8 10/06/2023   MCV 87.8 10/06/2023   PLT 310 10/06/2023   Lab Results  Component Value Date   NA 140 10/06/2023   K 4.4 10/06/2023   CL 102 10/06/2023   CO2 30 10/06/2023   Lab Results  Component Value Date   ALT 22 10/06/2023   AST 20 10/06/2023   BILITOT 0.9 10/06/2023      RADIOGRAPHY: US  RT BREAST BX W LOC DEV 1ST LESION IMG BX SPEC US  GUIDE Addendum Date: 06/14/2024 ADDENDUM REPORT: 06/14/2024 07:46 ADDENDUM: PATHOLOGY revealed: 1. Breast, right, needle core biopsy, 10 o'clock, 9 cmfn, venus clip- INVASIVE DUCTAL CARCINOMA WITH LOBULAR FEATURES- OVERALL GRADE: II; LYMPHOVASCULAR INVASION: NOT IDENTIFIED; CANCER LENGTH: 6 MM IN GREATEST LINEAR DIMENSION; CALCIFICATIONS: NOT IDENTIFIED. Pathology results are CONCORDANT with imaging findings, per Dr. Curtistine Noble. Pathology results and recommendations below were discussed with patient by telephone. Patient reported biopsy site doing well with no adverse symptoms, and only slight tenderness at the site. Post biopsy care instructions were reviewed, questions were answered and my direct phone number was provided. Patient was instructed to call [facility] for any additional questions or concerns related to biopsy site. RECOMMENDATION:  1. Surgical and oncological consultation. The patient was referred to [facility] at [facility] with appointment on 06/22/24. 2. Consider pretreatment bilateral breast MRI to determine extent of breast disease. Pathology results reported by Mliss CHARM Molt RN 06/10/2024. Electronically Signed   By: Curtistine Noble   On: 06/14/2024 07:46   Result Date: 06/14/2024 CLINICAL DATA:  64 year old female presents for a right breast ultrasound-guided biopsy. EXAM: ULTRASOUND GUIDED RIGHT BREAST CORE NEEDLE BIOPSY COMPARISON:  Previous exam(s). PROCEDURE: The procedure was discussed with the patient including benefits and alternatives. We discussed the risks of the procedure, including infection, bleeding, tissue injury and inadequate sampling. Post biopsy clip placement and possible clip migration were also discussed. Informed written consent was given. The usual time-out protocol was performed immediately prior to the procedure. Right breast: The patient was scanned and the area of interest in the 10 o' clock position, 9 cm from the nipple, was localized which correlates with the area of concern seen on prior imaging studies. This area was targeted for ultrasound  guided core needle biopsy. After sterile skin prep and 1% lidocaine with and without epinephrine for local anesthesia, a 14 gauge spring-loaded biopsy needle was used under direct ultrasound visualization to obtain several cores of tissue from the mass using a lateral to medial approach. Next, under ultrasound guidance, a Venus shaped clip was placed in the sampled mass. There were no immediate post-procedure complications. A follow up 2 view mammogram was performed and dictated separately. IMPRESSION: Ultrasound guided biopsy of the right breast as above. No apparent complications. Electronically Signed: By: Curtistine Noble On: 06/09/2024 10:06   MM CLIP PLACEMENT RIGHT Result Date:  06/09/2024 CLINICAL DATA:  64 year old female status post right breast ultrasound-guided biopsy. EXAM: 3D DIAGNOSTIC RIGHT MAMMOGRAM POST ULTRASOUND BIOPSY COMPARISON:  Previous exam(s). ACR Breast Density Category b: There are scattered areas of fibroglandular density. FINDINGS: 3D Mammographic images were obtained following ultrasound guided biopsy of the right breast at the 10 o'clock position, approximately 9 cm from the nipple. The biopsy marking clip is in expected position at the site of biopsy. IMPRESSION: Appropriate positioning of the Venus shaped biopsy marking clip at the site of biopsy in the right breast as above. Final Assessment: Post Procedure Mammograms for Marker Placement Electronically Signed   By: Curtistine Noble   On: 06/09/2024 10:04   MM 3D DIAGNOSTIC MAMMOGRAM UNILATERAL RIGHT BREAST Result Date: 06/08/2024 CLINICAL DATA:  64 year old woman recalled for possible RIGHT breast mass EXAM: DIGITAL DIAGNOSTIC UNILATERAL RIGHT MAMMOGRAM WITH TOMOSYNTHESIS AND CAD; ULTRASOUND RIGHT BREAST LIMITED TECHNIQUE: Right digital diagnostic mammography and breast tomosynthesis was performed. The images were evaluated with computer-aided detection. ; Targeted ultrasound examination of the right breast was performed COMPARISON:  Previous exam(s). ACR Breast Density Category b: There are scattered areas of fibroglandular density. FINDINGS: RIGHT: Mammogram: Spot-compression CC and MLO views of the RIGHT breast were obtained. Irregularly shaped mass with spiculated margins persists in the posterior depth of the upper-outer RIGHT breast. Ultrasound: Targeted sonographic evaluation of the upper-outer RIGHT breast and axilla was performed. 0.7 x 0.5 x 0.4 cm irregularly-shaped hypoechoic mass with angular margins seen at 10 o'clock 9 CMFN corresponds to the mammographic mass. Nonenlarged lymph node seen in the RIGHT axilla. IMPRESSION: Suspicious 0.7 cm RIGHT breast mass (10 o'clock 9 CMFN) corresponding to  the mammographic mass. RECOMMENDATION: Ultrasound-guided core needle biopsy of 0.7 cm RIGHT breast mass (10 o'clock 9 CMFN) I have discussed the findings and recommendations with the patient. The biopsy procedure was explained to the patient and questions were answered. Patient expressed their understanding of the biopsy recommendation. Patient will be scheduled for biopsy at her earliest convenience by the schedulers. Ordering provider will be notified. If applicable, a reminder letter will be sent to the patient regarding the next appointment. BI-RADS CATEGORY  4: Suspicious. Electronically Signed   By: Aliene Lloyd M.D.   On: 06/08/2024 08:54   US  LIMITED ULTRASOUND INCLUDING AXILLA RIGHT BREAST Result Date: 06/08/2024 CLINICAL DATA:  64 year old woman recalled for possible RIGHT breast mass EXAM: DIGITAL DIAGNOSTIC UNILATERAL RIGHT MAMMOGRAM WITH TOMOSYNTHESIS AND CAD; ULTRASOUND RIGHT BREAST LIMITED TECHNIQUE: Right digital diagnostic mammography and breast tomosynthesis was performed. The images were evaluated with computer-aided detection. ; Targeted ultrasound examination of the right breast was performed COMPARISON:  Previous exam(s). ACR Breast Density Category b: There are scattered areas of fibroglandular density. FINDINGS: RIGHT: Mammogram: Spot-compression CC and MLO views of the RIGHT breast were obtained. Irregularly shaped mass with spiculated margins persists in the posterior depth of the upper-outer RIGHT  breast. Ultrasound: Targeted sonographic evaluation of the upper-outer RIGHT breast and axilla was performed. 0.7 x 0.5 x 0.4 cm irregularly-shaped hypoechoic mass with angular margins seen at 10 o'clock 9 CMFN corresponds to the mammographic mass. Nonenlarged lymph node seen in the RIGHT axilla. IMPRESSION: Suspicious 0.7 cm RIGHT breast mass (10 o'clock 9 CMFN) corresponding to the mammographic mass. RECOMMENDATION: Ultrasound-guided core needle biopsy of 0.7 cm RIGHT breast mass (10 o'clock  9 CMFN) I have discussed the findings and recommendations with the patient. The biopsy procedure was explained to the patient and questions were answered. Patient expressed their understanding of the biopsy recommendation. Patient will be scheduled for biopsy at her earliest convenience by the schedulers. Ordering provider will be notified. If applicable, a reminder letter will be sent to the patient regarding the next appointment. BI-RADS CATEGORY  4: Suspicious. Electronically Signed   By: Aliene Lloyd M.D.   On: 06/08/2024 08:54   MM 3D SCREENING MAMMOGRAM BILATERAL BREAST Result Date: 06/02/2024 CLINICAL DATA:  Screening. EXAM: DIGITAL SCREENING BILATERAL MAMMOGRAM WITH TOMOSYNTHESIS AND CAD TECHNIQUE: Bilateral screening digital craniocaudal and mediolateral oblique mammograms were obtained. Bilateral screening digital breast tomosynthesis was performed. The images were evaluated with computer-aided detection. COMPARISON:  Previous exam(s). ACR Breast Density Category b: There are scattered areas of fibroglandular density. FINDINGS: In the right breast, a possible mass warrants further evaluation. In the left breast, no findings suspicious for malignancy. IMPRESSION: Further evaluation is suggested for a possible mass in the right breast. RECOMMENDATION: Diagnostic mammogram and possibly ultrasound of the right breast. (Code:[id number]-64M) The patient will be contacted regarding the findings, and additional imaging will be scheduled. BI-RADS CATEGORY  0: Incomplete: Need additional imaging evaluation. Electronically Signed   By: Alm Parkins M.D.   On: 06/02/2024 09:11     PATHOLOGY:  R Breast Biopsy 06/09/24:  Accession #: [id number] Patient Name: ALENI, ANDRUS Visit # : [id number]  MRN: [id number] Physician: Ted Faden DOB/Age [birth year] (Age: 10) Gender: F Collected Date: 06/09/2024 Received Date: 06/09/2024  FINAL DIAGNOSIS       1. Breast, right, needle core biopsy, 10 o'clock, 9cmfn,  venus clip :      - INVASIVE DUCTAL CARCINOMA WITH LOBULAR FEATURES (A CONFIRMATORY E-CADHERIN      STAIN IS PENDING AND WILL BE REPORTED IN AN ADDENDUM), SEE NOTE      - TUBULE FORMATION: SCORE 3/3      - NUCLEAR PLEOMORPHISM: SCORE 2/3      - MITOTIC COUNT: SCORE 1/3      - TOTAL SCORE: 6/9      - OVERALL GRADE: II/III      - LYMPHOVASCULAR INVASION: NOT IDENTIFIED      - CANCER LENGTH: 6 MM IN GREATEST LINEAR DIMENSION      - CALCIFICATIONS: NOT IDENTIFIED      - OTHER FINDINGS: N/A     IMMUNOHISTOCHEMICAL AND MORPHOMETRIC ANALYSIS PERFORMED MANUALLY The tumor cells are equivocal for Her2 (2+). Estrogen Receptor:  95%, positive, strong staining intensity Progesterone Receptor:  0%, negative Proliferation Marker Ki67: 15%   FLUORESCENCE IN-SITU HYBRIDIZATION   Results:  GROUP 5:   HER2 **NEGATIVE**    IMPRESSION/PLAN:   Ms. Peddy is a 64 yo F w/ anatomic and prognostic Stage IA [id number] IDC of the R breast, Grade 2, ER+/PR-/Her2-, Ki-67 15% who is seen preoperatively for consideration of adjuvant radiation therapy. We discussed the role of breast irradiation in reducing the risk of local recurrence and improving long-term disease control.  We  reviewed the logistics of treatment in detail, including the need for a CT simulation for treatment planning, followed by several days for contouring, dosimetry, and quality assurance prior to starting therapy.   The planned course of radiation therapy will consist of daily treatments, Monday through Friday, over approximately 3-4 weeks.We reviewed that each treatment session typically lasts only a few minutes, though positioning and setup may take additional time. The patient should plan to be in the department for approximately 45 minutes per visit. She will be evaluated by me at least once weekly during treatment to monitor for side effects, assess tolerance, and ensure it is safe to continue therapy.  We discussed acute and subacute side  effects which are generally gradual in onset and typically include fatigue, skin erythema, tanning or hyperpigmentation, breast edema or firmness and mild tenderness.  Less common but possible side effects include desquamation of the skin, decreased range of motion of the shoulder, and transient changes in breast size and texture.  Long-term risk such as cosmetic changes, rare risk of rib fracture and cardiopulmonary effects were also reviewed.  Reassured patient that most acute side effects improve over weeks to months after completing therapy.   Patient verbalized understanding of these risks and benefits and she is in agreement with the plan. I will see her in follow-up after surgery. Final treatment planning will depend on her postoperative pathology report, but we anticipate adjuvant whole breast radiation. Do not anticipate the need to radiate her axilla or any nodal regions.   --  Total time spent today in preparation for this visit was *** minutes. This included patient care, imaging and path review, documentation, multidisciplinary discussion and coordination of care and follow up.    Estefana HERO. Maritza, M.D.   "

## 2024-06-22 ENCOUNTER — Other Ambulatory Visit: Payer: Self-pay

## 2024-06-22 ENCOUNTER — Ambulatory Visit
Admission: RE | Admit: 2024-06-22 | Discharge: 2024-06-22 | Disposition: A | Discharge status: Home / Self Care | Source: Ambulatory Visit | Attending: Radiation Oncology | Admitting: Radiation Oncology

## 2024-06-22 ENCOUNTER — Encounter: Payer: Self-pay | Admitting: *Deleted

## 2024-06-22 ENCOUNTER — Inpatient Hospital Stay: Admitting: Genetic Counselor

## 2024-06-22 ENCOUNTER — Inpatient Hospital Stay

## 2024-06-22 ENCOUNTER — Encounter: Payer: Self-pay | Admitting: Physical Therapy

## 2024-06-22 ENCOUNTER — Inpatient Hospital Stay: Admitting: Licensed Clinical Social Worker

## 2024-06-22 ENCOUNTER — Encounter: Payer: Self-pay | Admitting: Genetic Counselor

## 2024-06-22 ENCOUNTER — Ambulatory Visit: Payer: Self-pay | Admitting: Surgery

## 2024-06-22 ENCOUNTER — Inpatient Hospital Stay: Attending: Hematology and Oncology | Admitting: Hematology and Oncology

## 2024-06-22 ENCOUNTER — Ambulatory Visit: Attending: Surgery | Admitting: Physical Therapy

## 2024-06-22 VITALS — BP 140/80 | HR 83 | Temp 97.6°F | Resp 17 | Ht 62.0 in | Wt 130.6 lb

## 2024-06-22 DIAGNOSIS — C50411 Malignant neoplasm of upper-outer quadrant of right female breast: Secondary | ICD-10-CM

## 2024-06-22 DIAGNOSIS — Z87891 Personal history of nicotine dependence: Secondary | ICD-10-CM | POA: Diagnosis not present

## 2024-06-22 DIAGNOSIS — Z170 Estrogen receptor positive status [ER+]: Secondary | ICD-10-CM

## 2024-06-22 DIAGNOSIS — C50911 Malignant neoplasm of unspecified site of right female breast: Secondary | ICD-10-CM

## 2024-06-22 DIAGNOSIS — R293 Abnormal posture: Secondary | ICD-10-CM | POA: Insufficient documentation

## 2024-06-22 DIAGNOSIS — Z800 Family history of malignant neoplasm of digestive organs: Secondary | ICD-10-CM | POA: Diagnosis not present

## 2024-06-22 DIAGNOSIS — Z803 Family history of malignant neoplasm of breast: Secondary | ICD-10-CM | POA: Diagnosis not present

## 2024-06-22 LAB — CBC WITH DIFFERENTIAL (CANCER CENTER ONLY)
Abs Immature Granulocytes: 0.02 10*3/uL (ref 0.00–0.07)
Basophils Absolute: 0.1 10*3/uL (ref 0.0–0.1)
Basophils Relative: 1 %
Eosinophils Absolute: 0 10*3/uL (ref 0.0–0.5)
Eosinophils Relative: 0 %
HCT: 43.5 % (ref 36.0–46.0)
Hemoglobin: 14.7 g/dL (ref 12.0–15.0)
Immature Granulocytes: 0 %
Lymphocytes Relative: 15 %
Lymphs Abs: 1.2 10*3/uL (ref 0.7–4.0)
MCH: 29.3 pg (ref 26.0–34.0)
MCHC: 33.8 g/dL (ref 30.0–36.0)
MCV: 86.8 fL (ref 80.0–100.0)
Monocytes Absolute: 0.4 10*3/uL (ref 0.1–1.0)
Monocytes Relative: 6 %
Neutro Abs: 6.2 10*3/uL (ref 1.7–7.7)
Neutrophils Relative %: 78 %
Platelet Count: 296 10*3/uL (ref 150–400)
RBC: 5.01 MIL/uL (ref 3.87–5.11)
RDW: 12.6 % (ref 11.5–15.5)
WBC Count: 7.9 10*3/uL (ref 4.0–10.5)
nRBC: 0 % (ref 0.0–0.2)

## 2024-06-22 LAB — GENETIC SCREENING ORDER

## 2024-06-22 LAB — CMP (CANCER CENTER ONLY)
ALT: 24 U/L (ref 0–44)
AST: 22 U/L (ref 15–41)
Albumin: 5.1 g/dL — ABNORMAL HIGH (ref 3.5–5.0)
Alkaline Phosphatase: 69 U/L (ref 38–126)
Anion gap: 13 (ref 5–15)
BUN: 11 mg/dL (ref 8–23)
CO2: 27 mmol/L (ref 22–32)
Calcium: 10.2 mg/dL (ref 8.9–10.3)
Chloride: 101 mmol/L (ref 98–111)
Creatinine: 0.78 mg/dL (ref 0.44–1.00)
GFR, Estimated: >60 mL/min
Glucose, Bld: 106 mg/dL — ABNORMAL HIGH (ref 70–99)
Potassium: 4.1 mmol/L (ref 3.5–5.1)
Sodium: 140 mmol/L (ref 135–145)
Total Bilirubin: 0.6 mg/dL (ref 0.0–1.2)
Total Protein: 8.3 g/dL — ABNORMAL HIGH (ref 6.5–8.1)

## 2024-06-22 NOTE — Progress Notes (Signed)
 REFERRING PROVIDER: Vanderbilt Ned, MD [address] [place],  [place] [address]  PRIMARY PROVIDER:  Duanne Butler DASEN, MD  PRIMARY REASON FOR VISIT:  1. Family history of colon cancer   2. Malignant neoplasm of upper-outer quadrant of right breast in female, estrogen receptor positive (HCC)   3. Family history of breast cancer      HISTORY OF PRESENT ILLNESS:   Ms. Wechter, a 64 y.o. female, was seen for a [facility] cancer genetics consultation at the request of Dr. Vanderbilt due to a personal and family history of breast cancer.  Ms. Dunckel presents to clinic today to discuss the possibility of a hereditary predisposition to cancer, genetic testing, and to further clarify her future cancer risks, as well as potential cancer risks for family members.   In January 2026, at the age of 53, Ms. Verrilli was diagnosed with cancer of the right breast. The treatment plan includes lumpectomy and oncotype.  She reports a family history of breast cancer in her daughter and niece and reports negative genetic testing in both women.  We do not have the reports for this testing at this time.    CANCER HISTORY:  Oncology History   No problem history exists.    Past Medical History:  Diagnosis Date   Family history of breast cancer    Family history of colon cancer    White coat syndrome without diagnosis of hypertension 11/14/2019    Past Surgical History:  Procedure Laterality Date   BREAST BIOPSY Left 2008   BREAST BIOPSY Right 06/09/2024   US  RT BREAST BX W LOC DEV 1ST LESION IMG BX SPEC US  GUIDE 06/09/2024 [facility]   COLONOSCOPY  2013   [facility], normal   COLONOSCOPY WITH PROPOFOL   11/20/2022   [facility] Cirigl[facility] at [facility]   TUBAL LIGATION  1987    Social History   Socioeconomic History   Marital status: Married    Spouse name: Not on file   Number of children: Not on file   Years of education: Not on file   Highest education level: Associate degree: occupational,  scientist, product/process development, or vocational program  Occupational History   Not on file  Tobacco Use   Smoking status: Former    Types: Cigarettes   Smokeless tobacco: Never  Vaping Use   Vaping status: Never Used  Substance and Sexual Activity   Alcohol use: Yes    Comment: occas   Drug use: Never   Sexual activity: Yes  Other Topics Concern   Not on file  Social History Narrative   Not on file   Social Drivers of Health   Tobacco Use: Medium Risk (06/22/2024)   Patient History    Smoking Tobacco Use: Former    Smokeless Tobacco Use: Never    Passive Exposure: Not on Actuary Strain: Low Risk (10/02/2023)   Overall Financial Resource Strain (CARDIA)    Difficulty of Paying Living Expenses: Not hard at all  Food Insecurity: No Food Insecurity (06/22/2024)   Epic    Worried About Radiation Protection Practitioner of Food in the Last Year: Never true    Ran Out of Food in the Last Year: Never true  Transportation Needs: No Transportation Needs (06/22/2024)   Epic    Lack of Transportation (Medical): No    Lack of Transportation (Non-Medical): No  Physical Activity: Sufficiently Active (10/02/2023)   Exercise Vital Sign    Days of Exercise per Week: 4 days  Minutes of Exercise per Session: 60 min  Stress: No Stress Concern Present (10/02/2023)   [employer] of Occupational Health - Occupational Stress Questionnaire    Feeling of Stress : Not at all  Social Connections: Moderately Integrated (10/02/2023)   Social Connection and Isolation Panel    Frequency of Communication with Friends and Family: More than three times a week    Frequency of Social Gatherings with Friends and Family: More than three times a week    Attends Religious Services: More than 4 times per year    Active Member of [employer] or Organizations: No    Attends Banker Meetings: Not on file    Marital Status: Married  Depression (PHQ2-9): Low Risk (06/22/2024)   Depression (PHQ2-9)    PHQ-2 Score: 0  Alcohol Screen:  Not on file  Housing: Low Risk (06/22/2024)   Epic    Unable to Pay for Housing in the Last Year: No    Number of Times Moved in the Last Year: 0    Homeless in the Last Year: No  Utilities: Not At Risk (06/22/2024)   Epic    Threatened with loss of utilities: No  Health Literacy: Not on file     FAMILY HISTORY:  We obtained a detailed, 4-generation family history.  Significant diagnoses are listed below: Family History  Problem Relation Age of Onset   Stroke Mother        past smoker   Colon polyps Sister    Colon polyps Sister    Colon cancer Maternal Aunt        dx. 63s   Cancer Maternal Grandmother        Female cancer   Heart disease Maternal Grandfather        MI--- at older age   Diabetes Paternal Grandmother    Breast cancer Daughter 95       Neg GT   Breast cancer Niece 78       Neg GT   Esophageal cancer Neg Hx    Rectal cancer Neg Hx    Stomach cancer Neg Hx      Significant family history reported includes: Daughter with breast cancer at 62 with reported negative testing Niece with breast cancer at 34 with reported negative testing. Maternal aunt with colon cancer Maternal grandmother with 'Female cancer  Ms. Martorano is aware of previous family history of genetic testing for hereditary cancer risks. There is no reported Ashkenazi Jewish ancestry. There is no known consanguinity.  GENETIC COUNSELING ASSESSMENT: Ms. Dam is a 64 y.o. female with a personal and family history of breast cancer which is somewhat suggestive of a hereditary cancer syndrome and predisposition to cancer given the number of women in the family with breast cancer. We, therefore, discussed and recommended the following at today's visit.   DISCUSSION: We discussed that, in general, most cancer is not inherited in families, but instead is sporadic or familial. Sporadic cancers occur by chance and typically happen at older ages (>50 years) as this type of cancer is caused by genetic  changes acquired during an individuals lifetime. Some families have more cancers than would be expected by chance; however, the ages or types of cancer are not consistent with a known genetic mutation or known genetic mutations have been ruled out. This type of familial cancer is thought to be due to a combination of multiple genetic, environmental, hormonal, and lifestyle factors. While this combination of factors likely increases the risk of cancer,  the exact source of this risk is not currently identifiable or testable.  We discussed that 5 - 10% of breast cancer is hereditary, with most cases associated with BRCA mutations.  There are other genes that can be associated with hereditary breast cancer syndromes.  These include ATM, CHEK2 and PALB2.  We discussed that testing is beneficial for several reasons including knowing how to follow individuals after completing their treatment, identifying whether potential treatment options such as PARP inhibitors would be beneficial, and understand if other family members could be at risk for cancer and allow them to undergo genetic testing.   We reviewed the characteristics, features and inheritance patterns of hereditary cancer syndromes. We also discussed genetic testing, including the appropriate family members to test, the process of testing, insurance coverage and turn-around-time for results. We discussed the implications of a negative, positive, carrier and/or variant of uncertain significant result. Ms. Hagg decided to pursue genetic testing for the BRCAPlus and CancerNext-Expanded+RNAinsight gene panel.   The CancerNext-Expanded gene panel offered by [facility] and includes sequencing, rearrangement, and RNA analysis for the following 77 genes: AIP, ALK, APC, ATM, BAP1, BARD1, BMPR1A, BRCA1, BRCA2, BRIP1, CDC73, CDH1, CDK4, CDKN1B, CDKN2A, CEBPA, CHEK2, CTNNA1, DDX41, DICER1, ETV6, FH, FLCN, GATA2, LZTR1, MAX, MBD4, MEN1, MET, MLH1, MSH2, MSH3, MSH6,  MUTYH, NF1, NF2, NTHL1, PALB2, PHOX2B, PMS2, POT1, PRKAR1A, PTCH1, PTEN, RAD51C, RAD51D, RB1, RET, RPS20, RUNX1, SDHA, SDHAF2, SDHB, SDHC, SDHD, SMAD4, SMARCA4, SMARCB1, SMARCE1, STK11, SUFU, TMEM127, TP53, TSC1, TSC2, VHL, and WT1 (sequencing and deletion/duplication); AXIN2, CTNNA1, DDX41, EGFR, HOXB13, KIT, MBD4, MITF, MSH3, PDGFRA, POLD1 and POLE (sequencing only); EPCAM and GREM1 (deletion/duplication only). RNA data is routinely analyzed for use in variant interpretation for all genes.   Based on Ms. Austad's personal and family history of cancer, she meets medical criteria for genetic testing.  Despite that she meets criteria, she may still have an out of pocket cost. We discussed that if her out of pocket cost for testing is over $100, the laboratory will call and confirm whether she wants to proceed with testing.  If the out of pocket cost of testing is less than $100 she will be billed by the genetic testing laboratory.   PLAN: After considering the risks, benefits, and limitations, Ms. Napoles provided informed consent to pursue genetic testing and the blood sample was sent to [facility] for analysis of the BRCAPlus and CancerNext-Expanded+RNAinsight. Results should be available within approximately 2-3 weeks' time, at which point they will be disclosed by telephone to Ms. Wiegert, as will any additional recommendations warranted by these results. Ms. Ayler will receive a summary of her genetic counseling visit and a copy of her results once available. This information will also be available in Epic.   Lastly, we encouraged Ms. Hunnicutt to remain in contact with cancer genetics annually so that we can continuously update the family history and inform her of any changes in cancer genetics and testing that may be of benefit for this family.   Ms. Brannick questions were answered to her satisfaction today. Our contact information was provided should additional questions or concerns arise.  Thank you for the referral and allowing us  to share in the care of your patient.   Lynne Righi P. Perri, MS, CGC Licensed, Patent Attorney [email or web address] phone: [phone]  I personally spent a total of 30 minutes in the care of the patient today including preparing to see the patient, getting/reviewing separately obtained history, performing a medically appropriate exam/evaluation, counseling and  educating, placing orders, referring and communicating with other health care professionals, and documenting clinical information in the EHR. The patient brought her husband. Drs. Lanny Stalls, and/or Gudena were available for questions, if needed..    _______________________________________________________________________ For Office Staff:  Number of people involved in session: 2 Was an Intern/ student involved with case: no

## 2024-06-22 NOTE — Therapy (Signed)
 " OUTPATIENT PHYSICAL THERAPY BREAST CANCER BASELINE EVALUATION   Patient Name: Megan Rodriguez MRN: [id number] DOB:22-Jan-1961, 64 y.o., female Today's Date: 06/22/2024  END OF SESSION:  PT End of Session - 06/22/24 1138     Visit Number 1    Number of Visits 2    Date for Recertification  08/17/24    PT Start Time 0949    PT Stop Time 1007   Also saw pt from [phone] for a total of 26 min   PT Time Calculation (min) 18 min    Activity Tolerance Patient tolerated treatment well    Behavior During Therapy [facility] for tasks assessed/performed          Past Medical History:  Diagnosis Date   Family history of breast cancer    Family history of colon cancer    White coat syndrome without diagnosis of hypertension 11/14/2019   Past Surgical History:  Procedure Laterality Date   BREAST BIOPSY Left 2008   BREAST BIOPSY Right 06/09/2024   US  RT BREAST BX W LOC DEV 1ST LESION IMG BX SPEC US  GUIDE 06/09/2024 [facility]   COLONOSCOPY  2013   Medoff, normal   COLONOSCOPY WITH PROPOFOL   11/20/2022   [facility]   TUBAL LIGATION  1987   Patient Active Problem List   Diagnosis Date Noted   Family history of breast cancer 06/22/2024   Family history of colon cancer    Malignant neoplasm of upper-outer quadrant of right breast in female, estrogen receptor positive (HCC) 06/17/2024   Hyperlipidemia 11/07/2022   White coat syndrome without diagnosis of hypertension 11/14/2019    REFERRING PROVIDER: Dr. Debby Shipper  REFERRING DIAG: Right breast cancer  THERAPY DIAG:  Malignant neoplasm of upper-outer quadrant of right breast in female, estrogen receptor positive (HCC)  Abnormal posture  Rationale for Evaluation and Treatment: Rehabilitation  ONSET DATE: 05/31/2024  SUBJECTIVE:                                                                                                                                                                                            SUBJECTIVE STATEMENT: Patient reports she is here today to be seen by her medical team for her newly diagnosed right breast cancer.   PERTINENT HISTORY:  Patient was diagnosed on 05/31/2024 with right grade 2 invasive ductal carcinoma breast cancer. It measures 7 mm and is located in the upper outer quadrant. It is ER positive, PR negative, and HER2 negative with a Ki67 of 15%.   PATIENT GOALS:   reduce lymphedema risk and learn post op HEP.   PAIN:  Are you  having pain? No  PRECAUTIONS: Active CA   RED FLAGS: None   HAND DOMINANCE: right  WEIGHT BEARING RESTRICTIONS: No  FALLS:  Has patient fallen in last 6 months? No  LIVING ENVIRONMENT: Patient lives with: husband Lives in: House/apartment Has following equipment at home: None  OCCUPATION: She is retired from working at an forensic [employer]  LEISURE: She hikes and plays with grandkids  PRIOR LEVEL OF FUNCTION: Independent   OBJECTIVE: Note: Objective measures were completed at Evaluation unless otherwise noted.  COGNITION: Overall cognitive status: Within functional limits for tasks assessed    POSTURE:  Forward head and rounded shoulders posture  UPPER EXTREMITY AROM/PROM:  A/PROM RIGHT   eval   Shoulder extension 58  Shoulder flexion 170  Shoulder abduction 178  Shoulder internal rotation 72  Shoulder external rotation 81    (Blank rows = not tested)  A/PROM LEFT   eval  Shoulder extension 57  Shoulder flexion 158  Shoulder abduction 172  Shoulder internal rotation 77  Shoulder external rotation 87    (Blank rows = not tested)  CERVICAL AROM: All within normal limits  UPPER EXTREMITY STRENGTH: WFL  LYMPHEDEMA ASSESSMENTS (in cm):   LANDMARK RIGHT   eval  10 cm proximal to olecranon process from proximal aspect of olecranon 26.5  Olecranon process 23.4  10 cm proximal to ulnar styloid process from proximal aspect of styloid process 21.3  Just distal to ulnar styloid process 13.7   Across hand at thumb web space 17.4  At base of 2nd digit 5.6  (Blank rows = not tested)  LANDMARK LEFT   eval  10 cm proximal to olecranon process from proximal aspect of olecranon 26.7   Olecranon process 23.5  10 cm proximal to ulnar styloid process from proximal aspect of styloid process 20  Just distal to ulnar styloid process 14  Across hand at thumb web space 17.7  At base of 2nd digit 5.2  (Blank rows = not tested)  L-DEX LYMPHEDEMA SCREENING:  The patient was assessed using the L-Dex machine today to produce a lymphedema index baseline score. The patient will be reassessed on a regular basis (typically every 3 months) to obtain new L-Dex scores. If the score is > 6.5 points away from his/her baseline score indicating onset of subclinical lymphedema, it will be recommended to wear a compression garment for 4 weeks, 12 hours per day and then be reassessed. If the score continues to be > 6.5 points from baseline at reassessment, we will initiate lymphedema treatment. Assessing in this manner has a 95% rate of preventing clinically significant lymphedema.   L-DEX FLOWSHEETS - 06/22/24 1100       L-DEX LYMPHEDEMA SCREENING   Measurement Type Unilateral    L-DEX MEASUREMENT EXTREMITY Upper Extremity    POSITION  Standing    DOMINANT SIDE Right    At Risk Side Right    BASELINE SCORE (UNILATERAL) -1.7          QUICK DASH SURVEY:  Junie Palin - 06/22/24 0001     Open a tight or new jar Mild difficulty    Do heavy household chores (wash walls, wash floors) No difficulty    Carry a shopping bag or briefcase No difficulty    Wash your back No difficulty    Use a knife to cut food No difficulty    Recreational activities in which you take some force or impact through your arm, shoulder, or hand (golf, hammering, tennis) No difficulty  During the past week, to what extent has your arm, shoulder or hand problem interfered with your normal social activities with family,  friends, neighbors, or groups? Not at all    During the past week, to what extent has your arm, shoulder or hand problem limited your work or other regular daily activities Not at all    Arm, shoulder, or hand pain. None    Tingling (pins and needles) in your arm, shoulder, or hand None    Difficulty Sleeping No difficulty    DASH Score 2.27 %           PATIENT EDUCATION:  Education details: Time spent educating patient on aspects of self-care to maximize post op recovery. Patient was educated on where and how to get a post op compression bra to use to reduce post op edema. Patient was also educated on the use of SOZO screenings and surveillance principles for early identification of lymphedema onset. She was instructed to use the post op pillow in the axilla for pressure and pain relief. Patient educated on lymphedema risk reduction and post op shoulder/posture HEP. Person educated: Patient Education method: Explanation, Demonstration, Handout Education comprehension: Patient verbalized understanding and returned demonstration  HOME EXERCISE PROGRAM: Patient was instructed today in a home exercise program today for post op shoulder range of motion. These included active assist shoulder flexion in sitting, scapular retraction, wall walking with shoulder abduction, and hands behind head external rotation.  She was encouraged to do these twice a day, holding 3 seconds and repeating 5 times when permitted by her physician.   ASSESSMENT:  CLINICAL IMPRESSION: Patient was diagnosed on 05/31/2024 with right grade 2 invasive ductal carcinoma breast cancer. It measures 7 mm and is located in the upper outer quadrant. It is ER positive, PR negative, and HER2 negative with a Ki67 of 15%. Her multidisciplinary medical team met prior to her assessments to determine a recommended treatment plan. She is planning to have a right lumpectomy and sentinel node biopsy followed by radiation and anti-estrogen  therapy. She will benefit from a post op PT reassessment to determine needs and from L-Dex screens every 3 months for 2 years to detect subclinical lymphedema.  Pt will benefit from skilled therapeutic intervention to improve on the following deficits: Decreased knowledge of precautions, impaired UE functional use, pain, decreased ROM, postural dysfunction.   PT treatment/interventions: ADL/self-care home management, pt/family education, therapeutic exercise  REHAB POTENTIAL: Excellent  CLINICAL DECISION MAKING: Stable/uncomplicated  EVALUATION COMPLEXITY: Low   GOALS: Goals reviewed with patient? YES  LONG TERM GOALS: (STG=LTG)    Name Target Date Goal status  1 Pt will be able to verbalize understanding of pertinent lymphedema risk reduction practices relevant to her dx specifically related to skin care.  Baseline:  No knowledge 06/22/2024 Achieved at eval  2 Pt will be able to return demo and/or verbalize understanding of the post op HEP related to regaining shoulder ROM. Baseline:  No knowledge 06/22/2024 Achieved at eval  3 Pt will be able to verbalize understanding of the importance of viewing the post op After Breast CA Class video for further lymphedema risk reduction education and therapeutic exercise.  Baseline:  No knowledge 06/22/2024 Achieved at eval  4 Pt will demo she has regained full shoulder ROM and function post operatively compared to baselines.  Baseline: See objective measurements taken today. 08/17/2024     PLAN:  PT FREQUENCY/DURATION: EVAL and 1 follow up appointment.   PLAN FOR NEXT SESSION: will reassess  3-4 weeks post op to determine needs.   Patient will follow up at outpatient cancer rehab 3-4 weeks following surgery.  If the patient requires physical therapy at that time, a specific plan will be dictated and sent to the referring physician for approval. The patient was educated today on appropriate basic range of motion exercises to begin post  operatively and the importance of viewing the After Breast Cancer class video following surgery.  Patient was educated today on lymphedema risk reduction practices as it pertains to recommendations that will benefit the patient immediately following surgery.  She verbalized good understanding.    Physical Therapy Information for After Breast Cancer Surgery/Treatment:  Lymphedema is a swelling condition that you may be at risk for in your arm if you have lymph nodes removed from the armpit area.  After a sentinel node biopsy, the risk is approximately 5-9% and is higher after an axillary node dissection.  There is treatment available for this condition and it is not life-threatening.  Contact your physician or physical therapist with concerns. You may begin the 4 shoulder/posture exercises (see additional sheet) when permitted by your physician (typically a week after surgery).  If you have drains, you may need to wait until those are removed before beginning range of motion exercises.  A general recommendation is to not lift your arms above shoulder height until drains are removed.  These exercises should be done to your tolerance and gently.  This is not a no pain/no gain type of recovery so listen to your body and stretch into the range of motion that you can tolerate, stopping if you have pain.  If you are having immediate reconstruction, ask your plastic surgeon about doing exercises as he or she may want you to wait. We encourage you to view the After Breast Cancer class video following surgery.  You will learn information related to lymphedema risk, prevention and treatment and additional exercises to regain mobility following surgery.   While undergoing any medical procedure or treatment, try to avoid blood pressure being taken or needle sticks from occurring on the arm on the side of cancer.   This recommendation begins after surgery and continues for the rest of your life.  This may help reduce  your risk of getting lymphedema (swelling in your arm). An excellent resource for those seeking information on lymphedema is the National Lymphedema Network's web site. It can be accessed at www.lymphnet.org If you notice swelling in your hand, arm or breast at any time following surgery (even if it is many years from now), please contact your doctor or physical therapist to discuss this.  Lymphedema can be treated at any time but it is easier for you if it is treated early on.  If you feel like your shoulder motion is not returning to normal in a reasonable amount of time, please contact your surgeon or physical therapist.  [facility] [phone]. [address], [address], [place] [address]  [facility] CLASS After Breast Cancer Class  After Breast Cancer Class is a specially designed exercise class video to assist you in a safe recover after having breast cancer surgery.  In this video you will learn how to get back to full function whether your drains were just removed or if you had surgery a month ago. The video can be viewed on this page: [email or web address].boyd-meyer.org/ or on YouTube here: https://youtu.az/[email or web address].  Class Goals  Understand specific stretches to improve the flexibility of you chest  and shoulder. Learn ways to safely strengthen your upper body and improve your posture. Understand the warning signs of infection and why you may be at risk for an arm infection. Learn about Lymphedema and prevention.  ** You do not need to view this video until after surgery.  Drains should be removed to participate in the recommended exercises on the video.  Patient was instructed today in a home exercise program today for post op shoulder range of motion. These included active assist shoulder flexion in sitting, scapular retraction, wall walking with shoulder abduction, and hands behind head external  rotation.  She was encouraged to do these twice a day, holding 3 seconds and repeating 5 times when permitted by her physician.  Eward Wonda Sharps, [place] 06/22/24 11:46 AM  "

## 2024-06-22 NOTE — Progress Notes (Signed)
 [facility] Clinical Social Work  Initial Assessment   Megan Rodriguez is a 64 y.o. year old female accompanied by husband, Megan Rodriguez. Clinical Social Work was referred by [facility] for assessment of psychosocial needs.   SDOH (Social Determinants of Health) assessments performed: Yes SDOH Interventions    Flowsheet Row Clinical Support from 06/22/2024 in [facility] A Dept Of [place]. [facility] Most recent reading at 06/22/2024 11:02 AM Office Visit from 06/22/2024 in [facility] - A Dept Of [place]. [facility] Most recent reading at 06/22/2024  8:56 AM  SDOH Interventions    Food Insecurity Interventions Intervention Not Indicated Patient Declined  Housing Interventions Intervention Not Indicated Patient Declined  Transportation Interventions Intervention Not Indicated Patient Declined  Utilities Interventions Intervention Not Indicated Patient Declined    SDOH Screenings   Food Insecurity: No Food Insecurity (06/22/2024)  Housing: Low Risk (06/22/2024)  Transportation Needs: No Transportation Needs (06/22/2024)  Utilities: Not At Risk (06/22/2024)  Depression (PHQ2-9): Low Risk (06/22/2024)  Financial Resource Strain: Low Risk (10/02/2023)  Physical Activity: Sufficiently Active (10/02/2023)  Social Connections: Moderately Integrated (10/02/2023)  Stress: No Stress Concern Present (10/02/2023)  Tobacco Use: Medium Risk (06/22/2024)    PHQ 2/9:    06/22/2024   11:02 AM 06/22/2024    8:54 AM 10/06/2023    9:23 AM  Depression screen PHQ 2/9  Decreased Interest 0 0 0  Down, Depressed, Hopeless 0 0 0  PHQ - 2 Score 0 0 0     Distress Screen completed: No     No data to display            Family/Social Information:  Housing Arrangement: patient lives with her husband Megan Rodriguez Family members/support persons in your life? Family (husband, son, daughter, nieces, sisters) and Friends Transportation concerns: no  Employment: Retired.  Financial  concerns: No Type of concern: None Food access concerns: no Religious or spiritual practice: Not known Advanced directives: Yes-not on file Services Currently in place:  [facility]  Coping/ Adjustment to diagnosis: Patient understands treatment plan and what happens next? Yes (lump, oncotype, radiation, AI) Concerns about diagnosis and/or treatment: Overwhelmed by information Patient reported stressors: Adjusting to my illness Patient enjoys going to the beach, cooking, time with family, especially her grandkids- two grandsons ages 77 & 63 and one granddaughter- turning 1 in a few weeks Current coping skills/ strengths: Capable of independent living , Motivation for treatment/growth , and Supportive family/friends     SUMMARY: Current SDOH Barriers:  No major barriers identified today  Clinical Social Work Clinical Goal(s):  No clinical social work goals at this time  Interventions: Discussed common feeling and emotions when being diagnosed with cancer, and the importance of support during treatment Informed patient of the support team roles and support services at [facility] Provided CSW contact information and encouraged patient to call with any questions or concerns   Follow Up Plan: Patient will contact CSW with any support or resource needs Patient verbalizes understanding of plan: Yes    Megan Reddick E Benz Vandenberghe, LCSW Clinical Social Worker [facility]

## 2024-06-22 NOTE — Research (Signed)
 Trial:  [employer] 2021-05 - Specimen Collection Study to Evaluate Biomarkers in Subjects with Cancer  Patient Megan Rodriguez was identified by Dr Loretha as a potential candidate for the above listed study.  This Clinical Research Nurse met with Megan Rodriguez, [id number], on 06/22/24 in a manner and location that ensures patient privacy to discuss participation in the above listed research study.  Patient is Accompanied by her husband.  A copy of the informed consent document with embedded HIPAA language was provided to the patient.  Patient reads, speaks, and understands English.   Patient feels too overwhelmed with new diagnosis and does not wish to pursue enrollment in this study. She understands that, if she is eligible for future studies, Dr Loretha may ask us  to reach out to her later.  Andrea MORTON Fortune Brannigan, RN, BSN, [facility] She  Her  Hers Clinical Research Nurse [facility] Direct Dial [phone] 06/22/2024 3:17 PM

## 2024-06-23 ENCOUNTER — Telehealth: Payer: Self-pay | Admitting: Hematology and Oncology

## 2024-06-23 NOTE — Telephone Encounter (Signed)
 I spoke with patient and she is scheduled for 07/29/2024.

## 2024-06-23 NOTE — Progress Notes (Signed)
 [facility] CONSULT NOTE  Patient Care Team: Duanne Butler DASEN, MD as PCP - General (Family Medicine) Tyree Nanetta SAILOR, RN as Oncology Nurse Navigator Vanderbilt Ned, MD as Consulting Physician (General Surgery) Loretha Ash, MD as Consulting Physician (Hematology and Oncology) Maritza Stagger, MD as Consulting Physician (Radiation Oncology)  CHIEF COMPLAINTS/PURPOSE OF CONSULTATION:  Newly diagnosed breast cancer  HISTORY OF PRESENTING ILLNESS:  Megan Rodriguez 64 y.o. female is here because of recent diagnosis of right breast cancer   I reviewed her records extensively and collaborated the history with the patient.  SUMMARY OF ONCOLOGIC HISTORY: Oncology History   No problem history exists.   Discussed the use of AI scribe software for clinical note transcription with the patient, who gave verbal consent to proceed.  History of Present Illness Megan Rodriguez is a 64 year old woman with newly diagnosed right breast invasive ductal carcinoma who presents for initial oncology consultation to discuss management.  She has a non-palpable invasive ductal carcinoma in the upper outer quadrant of the right breast at the ten o'clock position, identified on imaging. She is unable to palpate the mass due to its small size. She denies breast pain, nipple discharge, or cutaneous changes. She has not experienced systemic symptoms such as unintentional weight loss, fevers, or night sweats.  She is postmenopausal and does not use hormone replacement therapy. She has no history of breastfeeding. She previously used birth control for a short duration. She quit smoking many years ago and was not a heavy smoker.  She has a prior diagnosis of squamous cell carcinoma of the skin, treated with topical therapy, with no evidence of recurrence. She has no history of melanoma. She is up to date with age-appropriate cancer screenings and remains active in retirement.    MEDICAL HISTORY:  Past  Medical History:  Diagnosis Date   Family history of breast cancer    Family history of colon cancer    White coat syndrome without diagnosis of hypertension 11/14/2019    SURGICAL HISTORY: Past Surgical History:  Procedure Laterality Date   BREAST BIOPSY Left 2008   BREAST BIOPSY Right 06/09/2024   US  RT BREAST BX W LOC DEV 1ST LESION IMG BX SPEC US  GUIDE 06/09/2024 [facility]   COLONOSCOPY  2013   Medoff, normal   COLONOSCOPY WITH PROPOFOL   11/20/2022   [facility] Cirigliano at [facility]   TUBAL LIGATION  1987    SOCIAL HISTORY: Social History   Socioeconomic History   Marital status: Married    Spouse name: Not on file   Number of children: Not on file   Years of education: Not on file   Highest education level: Associate degree: occupational, scientist, product/process development, or vocational program  Occupational History   Not on file  Tobacco Use   Smoking status: Former    Types: Cigarettes   Smokeless tobacco: Never  Vaping Use   Vaping status: Never Used  Substance and Sexual Activity   Alcohol use: Yes    Comment: occas   Drug use: Never   Sexual activity: Yes  Other Topics Concern   Not on file  Social History Narrative   Not on file   Social Drivers of Health   Tobacco Use: Medium Risk (06/22/2024)   Patient History    Smoking Tobacco Use: Former    Smokeless Tobacco Use: Never    Passive Exposure: Not on file  Financial Resource Strain: Low Risk (10/02/2023)   Overall Financial Resource Strain (CARDIA)    Difficulty of  Paying Living Expenses: Not hard at all  Food Insecurity: No Food Insecurity (06/22/2024)   Epic    Worried About Programme Researcher, Broadcasting/film/video in the Last Year: Never true    Ran Out of Food in the Last Year: Never true  Transportation Needs: No Transportation Needs (06/22/2024)   Epic    Lack of Transportation (Medical): No    Lack of Transportation (Non-Medical): No  Physical Activity: Sufficiently Active (10/02/2023)   Exercise Vital Sign    Days of Exercise per  Week: 4 days    Minutes of Exercise per Session: 60 min  Stress: No Stress Concern Present (10/02/2023)   [employer] of Occupational Health - Occupational Stress Questionnaire    Feeling of Stress : Not at all  Social Connections: Moderately Integrated (10/02/2023)   Social Connection and Isolation Panel    Frequency of Communication with Friends and Family: More than three times a week    Frequency of Social Gatherings with Friends and Family: More than three times a week    Attends Religious Services: More than 4 times per year    Active Member of [employer] or Organizations: No    Attends Banker Meetings: Not on file    Marital Status: Married  Intimate Partner Violence: Not At Risk (06/22/2024)   Epic    Fear of Current or Ex-Partner: No    Emotionally Abused: No    Physically Abused: No    Sexually Abused: No  Depression (PHQ2-9): Low Risk (06/22/2024)   Depression (PHQ2-9)    PHQ-2 Score: 0  Alcohol Screen: Not on file  Housing: Low Risk (06/22/2024)   Epic    Unable to Pay for Housing in the Last Year: No    Number of Times Moved in the Last Year: 0    Homeless in the Last Year: No  Utilities: Not At Risk (06/22/2024)   Epic    Threatened with loss of utilities: No  Health Literacy: Not on file    FAMILY HISTORY: Family History  Problem Relation Age of Onset   Stroke Mother        past smoker   Colon polyps Sister    Colon polyps Sister    Colon cancer Maternal Aunt        dx. 49s   Cancer Maternal Grandmother        Female cancer   Heart disease Maternal Grandfather        MI--- at older age   Diabetes Paternal Grandmother    Breast cancer Daughter 54       Neg GT   Breast cancer Niece 20       Neg GT   Esophageal cancer Neg Hx    Rectal cancer Neg Hx    Stomach cancer Neg Hx     ALLERGIES:  has no known allergies.  MEDICATIONS:  Current Outpatient Medications  Medication Sig Dispense Refill   atorvastatin  (LIPITOR) 10 MG tablet TAKE 1  TABLET(10 MG) BY MOUTH DAILY 90 tablet 3   Calcium  Citrate-Vitamin D  (CITRACAL + D PO)      tretinoin (RETIN-A) 0.025 % cream SMARTSIG:Sparingly Topical Every Other Day (Patient not taking: Reported on 06/22/2024)     No current facility-administered medications for this visit.    REVIEW OF SYSTEMS:  as listed in hpi  PHYSICAL EXAMINATION: ECOG PERFORMANCE STATUS: 0 - Asymptomatic  Vitals:   06/22/24 0853 06/22/24 0854  BP: (!) 160/90 (!) 140/80  Pulse: 83   Resp:  17   Temp: 97.6 F (36.4 C)   SpO2: 99%    Filed Weights   06/22/24 0853  Weight: 130 lb 9.6 oz (59.2 kg)    GENERAL:alert, no distress and comfortable SKIN: skin color, texture, turgor are normal, no rashes or significant lesions EYES: normal, conjunctiva are pink and non-injected, sclera clear OROPHARYNX:no exudate, no erythema and lips, buccal mucosa, and tongue normal  NECK: supple, thyroid normal size, non-tender, without nodularity LYMPH:  no palpable lymphadenopathy in the cervical, axillary or inguinal LUNGS: clear to auscultation and percussion with normal breathing effort HEART: regular rate & rhythm and no murmurs and no lower extremity edema ABDOMEN:abdomen soft, non-tender and normal bowel sounds Musculoskeletal:no cyanosis of digits and no clubbing  PSYCH: alert & oriented x 3 with fluent speech   LABORATORY DATA:  I have reviewed the data as listed Lab Results  Component Value Date   WBC 7.9 06/22/2024   HGB 14.7 06/22/2024   HCT 43.5 06/22/2024   MCV 86.8 06/22/2024   PLT 296 06/22/2024   Lab Results  Component Value Date   NA 140 06/22/2024   K 4.1 06/22/2024   CL 101 06/22/2024   CO2 27 06/22/2024    RADIOGRAPHIC STUDIES: I have personally reviewed the radiological reports and agreed with the findings in the report.  ASSESSMENT AND PLAN:   Assessment and Plan Assessment & Plan Invasive ductal carcinoma of the right breast Newly diagnosed, small, non-palpable invasive ductal  carcinoma in the upper outer quadrant of the right breast. Estrogen receptor positive, progesterone receptor negative. Lymph nodes uninvolved. Postmenopausal, otherwise healthy.  - Proceed with lumpectomy as planned by surgery. - Send tumor specimen for Oncotype DX testing post-lumpectomy to assess recurrence risk and guide adjuvant therapy. - Review Oncotype DX results post-operatively to determine chemotherapy need; if score is <26, do not recommend chemotherapy. - Refer for adjuvant radiation therapy following lumpectomy. - Initiate anti-estrogen therapy with aromatase inhibitor post-radiation for five years. Discussed aromatase inhibitors as preferred for postmenopausal women, mechanism of action, and five-year efficacy.  - Schedule follow-up in 4-5 weeks to review Oncotype DX results and finalize adjuvant therapy plan. - Provide long-term oncology follow-up for five years.     All questions were answered. The patient knows to call the clinic with any problems, questions or concerns.    Amber Stalls, MD 06/23/24

## 2024-06-29 ENCOUNTER — Other Ambulatory Visit: Payer: Self-pay | Admitting: Surgery

## 2024-06-29 ENCOUNTER — Ambulatory Visit: Payer: Self-pay | Admitting: Genetic Counselor

## 2024-06-29 ENCOUNTER — Telehealth: Payer: Self-pay | Admitting: Genetic Counselor

## 2024-06-29 DIAGNOSIS — Z803 Family history of malignant neoplasm of breast: Secondary | ICD-10-CM

## 2024-06-29 DIAGNOSIS — Z1379 Encounter for other screening for genetic and chromosomal anomalies: Secondary | ICD-10-CM

## 2024-06-29 DIAGNOSIS — C50411 Malignant neoplasm of upper-outer quadrant of right female breast: Secondary | ICD-10-CM

## 2024-06-29 DIAGNOSIS — Z800 Family history of malignant neoplasm of digestive organs: Secondary | ICD-10-CM

## 2024-06-29 DIAGNOSIS — C50911 Malignant neoplasm of unspecified site of right female breast: Secondary | ICD-10-CM

## 2024-06-29 NOTE — Progress Notes (Signed)
 HPI:  [name] was previously seen in the [facility] due to a personal and family history of cancer and polyps and concerns regarding a hereditary predisposition to cancer. Please refer to our prior cancer genetics clinic note for more information regarding our discussion, assessment and recommendations, at the time. [name] recent genetic test results were disclosed to her, as were recommendations warranted by these results. These results and recommendations are discussed in more detail below.  CANCER HISTORY:  Oncology History  Malignant neoplasm of upper-outer quadrant of right breast in female, estrogen receptor positive (HCC)  06/17/2024 Initial Diagnosis   Malignant neoplasm of upper-outer quadrant of right breast in female, estrogen receptor positive (HCC)   06/27/2024 Genetic Testing   Negative genetic testing. Report date is 06/27/2024.  The CancerNext-Expanded gene panel offered by [facility] and includes sequencing, rearrangement, and RNA analysis for the following 77 genes: AIP, ALK, APC, ATM, BAP1, BARD1, BMPR1A, BRCA1, BRCA2, BRIP1, CDC73, CDH1, CDK4, CDKN1B, CDKN2A, CEBPA, CHEK2, CTNNA1, DDX41, DICER1, ETV6, FH, FLCN, GATA2, LZTR1, MAX, MBD4, MEN1, MET, MLH1, MSH2, MSH3, MSH6, MUTYH, NF1, NF2, NTHL1, PALB2, PHOX2B, PMS2, POT1, PRKAR1A, PTCH1, PTEN, RAD51C, RAD51D, RB1, RET, RPS20, RUNX1, SDHA, SDHAF2, SDHB, SDHC, SDHD, SMAD4, SMARCA4, SMARCB1, SMARCE1, STK11, SUFU, TMEM127, TP53, TSC1, TSC2, VHL, and WT1 (sequencing and deletion/duplication); AXIN2, CTNNA1, DDX41, EGFR, HOXB13, KIT, MBD4, MITF, MSH3, PDGFRA, POLD1 and POLE (sequencing only); EPCAM and GREM1 (deletion/duplication only). RNA data is routinely analyzed for use in variant interpretation for all genes.      FAMILY HISTORY:  We obtained a detailed, 4-generation family history.  Significant diagnoses are listed below: Family History  Problem Relation Age of Onset   Stroke Mother        past  smoker   Colon polyps Sister    Colon polyps Sister    Colon cancer Maternal Aunt        dx. 28s   Cancer Maternal Grandmother        Female cancer   Heart disease Maternal Grandfather        MI--- at older age   Diabetes Paternal Grandmother    Breast cancer Daughter 61       Neg GT   Breast cancer Niece 36       Neg GT   Esophageal cancer Neg Hx    Rectal cancer Neg Hx    Stomach cancer Neg Hx        Significant family history reported includes: Daughter with breast cancer at 86 with reported negative testing Niece with breast cancer at 66 with reported negative testing. Maternal aunt with colon cancer Maternal grandmother with 'Female cancer   [name] is aware of previous family history of genetic testing for hereditary cancer risks. There is no reported Ashkenazi Jewish ancestry. There is no known consanguinity.  GENETIC TEST RESULTS: Genetic testing reported out on June 27, 2024 through the [facility] CancerNext-Expanded +RNA insight cancer panel found no pathogenic mutations. The CancerNext-Expanded gene panel offered by [facility] and includes sequencing, rearrangement, and RNA analysis for the following 77 genes: AIP, ALK, APC, ATM, BAP1, BARD1, BMPR1A, BRCA1, BRCA2, BRIP1, CDC73, CDH1, CDK4, CDKN1B, CDKN2A, CEBPA, CHEK2, CTNNA1, DDX41, DICER1, ETV6, FH, FLCN, GATA2, LZTR1, MAX, MBD4, MEN1, MET, MLH1, MSH2, MSH3, MSH6, MUTYH, NF1, NF2, NTHL1, PALB2, PHOX2B, PMS2, POT1, PRKAR1A, PTCH1, PTEN, RAD51C, RAD51D, RB1, RET, RPS20, RUNX1, SDHA, SDHAF2, SDHB, SDHC, SDHD, SMAD4, SMARCA4, SMARCB1, SMARCE1, STK11, SUFU, TMEM127, TP53, TSC1, TSC2, VHL, and WT1 (sequencing  and deletion/duplication); AXIN2, CTNNA1, DDX41, EGFR, HOXB13, KIT, MBD4, MITF, MSH3, PDGFRA, POLD1 and POLE (sequencing only); EPCAM and GREM1 (deletion/duplication only). RNA data is routinely analyzed for use in variant interpretation for all genes. The test report has been scanned into EPIC and is located under the  Molecular Pathology section of the Results Review tab.  A portion of the result report is included below for reference.     We discussed with [name] that because current genetic testing is not perfect, it is possible there may be a gene mutation in one of these genes that current testing cannot detect, but that chance is small.  We also discussed, that there could be another gene that has not yet been discovered, or that we have not yet tested, that is responsible for the cancer diagnoses in the family. It is also possible there is a hereditary cause for the cancer in the family that [name] did not inherit and therefore was not identified in her testing.  Therefore, it is important to remain in touch with cancer genetics in the future so that we can continue to offer [name] the most up to date genetic testing.   ADDITIONAL GENETIC TESTING: We discussed with [name] that her genetic testing was fairly extensive.  If there are genes identified to increase cancer risk that can be analyzed in the future, we would be happy to discuss and coordinate this testing at that time.    CANCER SCREENING RECOMMENDATIONS: [name] test result is considered negative (normal).  This means that we have not identified a hereditary cause for her personal and family history of cancer and polyps at this time. Most cancers happen by chance and this negative test suggests that her personal and family history of cancer and polyps may fall into this category.    Possible reasons for [name]'s negative genetic test include:  1. There may be a gene mutation in one of these genes that current testing methods cannot detect but that chance is small.  2. There could be another gene that has not yet been discovered, or that we have not yet tested, that is responsible for the cancer diagnoses in the family.  3.  There may be no hereditary risk for cancer in the family. The cancers in [name] and/or her family may be  sporadic/familial or due to other genetic and environmental factors. 4. It is also possible there is a hereditary cause for the cancer in the family that [name] did not inherit.  Therefore, it is recommended she continue to follow the cancer management and screening guidelines provided by her oncology and primary healthcare provider. An individual's cancer risk and medical management are not determined by genetic test results alone. Overall cancer risk assessment incorporates additional factors, including personal medical history, family history, and any available genetic information that may result in a personalized plan for cancer prevention and surveillance   RECOMMENDATIONS FOR FAMILY MEMBERS:   Since she did not inherit a identifiable mutation in a cancer predisposition gene included on this panel, her children could not have inherited a known mutation from her in one of these genes. Individuals in this family might be at some increased risk of developing cancer, over the general population risk, simply due to the family history of cancer.  We recommended women in this family have a yearly mammogram beginning at age 71, or 100 years younger than the earliest onset of cancer, an annual clinical breast exam,  and perform monthly breast self-exams. Women in this family should also have a gynecological exam as recommended by their primary provider. All family members should be referred for colonoscopy starting at age 66, or 27 years younger than the earliest onset of cancer.  FOLLOW-UP: Lastly, we discussed with [name] that cancer genetics is a rapidly advancing field and it is possible that new genetic tests will be appropriate for her and/or her family members in the future. We encouraged her to remain in contact with cancer genetics on an annual basis so we can update her personal and family histories and let her know of advances in cancer genetics that may benefit this family.   Our contact  number was provided. [name] questions were answered to her satisfaction, and she knows she is welcome to call us  at anytime with additional questions or concerns.   Darice Monte, MS, [facility] Licensed, Certified Genetic Counselor [email or web address]

## 2024-06-29 NOTE — Telephone Encounter (Signed)
 I contacted  Megan Rodriguez to discuss her genetic testing results. No pathogenic variants were identified in the 77 genes analyzed. Discussed that we do not know why she has cancer or why there is cancer in the family. It could be due to a different gene that we are not testing, or maybe our current technology may not be able to pick something up.  It will be important for her to keep in contact with genetics to keep up with whether additional testing may be needed.Detailed clinic note to follow.   The test report will be scanned into EPIC and will be located under the Molecular Pathology section of the Results Review tab.  A portion of the result report is included below for reference.

## 2024-06-30 ENCOUNTER — Encounter: Payer: Self-pay | Admitting: *Deleted

## 2024-06-30 ENCOUNTER — Telehealth: Payer: Self-pay | Admitting: *Deleted

## 2024-06-30 NOTE — Telephone Encounter (Signed)
Spoke with patient to follow up from [facility] and assess navigation needs. Patient denies any questions or concerns at this time. Encouraged her to call should anything arise.Patient verbalized understanding.  

## 2024-07-01 ENCOUNTER — Telehealth: Payer: Self-pay | Admitting: Hematology and Oncology

## 2024-07-01 NOTE — Telephone Encounter (Signed)
 I spoke with patient and she is aware of MD appointment on 08/11/2024.

## 2024-07-19 ENCOUNTER — Encounter

## 2024-07-20 ENCOUNTER — Encounter

## 2024-07-20 ENCOUNTER — Ambulatory Visit (HOSPITAL_BASED_OUTPATIENT_CLINIC_OR_DEPARTMENT_OTHER): Admit: 2024-07-20 | Admitting: Surgery

## 2024-07-20 ENCOUNTER — Encounter (HOSPITAL_BASED_OUTPATIENT_CLINIC_OR_DEPARTMENT_OTHER): Payer: Self-pay

## 2024-07-29 ENCOUNTER — Inpatient Hospital Stay: Admitting: Hematology and Oncology

## 2024-08-11 ENCOUNTER — Inpatient Hospital Stay: Admitting: Hematology and Oncology
